# Patient Record
Sex: Male | Born: 1960 | Race: Black or African American | Hispanic: No | Marital: Married | Smoking: Never smoker
Health system: Southern US, Community
[De-identification: ages and names within clinical notes are randomized; demographics above are authoritative.]

## PROBLEM LIST (undated history)

## (undated) ENCOUNTER — Ambulatory Visit: Discharge status: Absent without leave | Payer: 59

## (undated) DIAGNOSIS — B1910 Unspecified viral hepatitis B without hepatic coma: Secondary | ICD-10-CM

## (undated) DIAGNOSIS — R110 Nausea: Secondary | ICD-10-CM

## (undated) DIAGNOSIS — I10 Essential (primary) hypertension: Secondary | ICD-10-CM

## (undated) HISTORY — DX: Essential (primary) hypertension: I10

## (undated) HISTORY — DX: Unspecified viral hepatitis B without hepatic coma: B19.10

---

## 2005-05-04 ENCOUNTER — Encounter: Admission: RE | Admit: 2005-05-04 | Discharge: 2005-05-04 | Payer: Self-pay | Admitting: Family Medicine

## 2005-06-08 ENCOUNTER — Encounter: Admission: RE | Admit: 2005-06-08 | Discharge: 2005-06-08 | Payer: Self-pay | Admitting: Family Medicine

## 2007-03-27 HISTORY — PX: MASS EXCISION: SHX2000

## 2008-04-27 ENCOUNTER — Encounter: Admission: RE | Admit: 2008-04-27 | Discharge: 2008-04-27 | Payer: Self-pay | Admitting: Family Medicine

## 2008-05-11 ENCOUNTER — Encounter: Admission: RE | Admit: 2008-05-11 | Discharge: 2008-05-11 | Payer: Self-pay | Admitting: Family Medicine

## 2008-05-19 ENCOUNTER — Emergency Department (HOSPITAL_COMMUNITY): Admission: EM | Admit: 2008-05-19 | Discharge: 2008-05-19 | Payer: Self-pay | Admitting: Emergency Medicine

## 2008-09-29 ENCOUNTER — Ambulatory Visit (HOSPITAL_COMMUNITY): Admission: RE | Admit: 2008-09-29 | Discharge: 2008-09-29 | Payer: Self-pay | Admitting: General Surgery

## 2008-09-29 ENCOUNTER — Encounter (INDEPENDENT_AMBULATORY_CARE_PROVIDER_SITE_OTHER): Payer: Self-pay | Admitting: General Surgery

## 2009-01-06 ENCOUNTER — Encounter: Admission: RE | Admit: 2009-01-06 | Discharge: 2009-01-06 | Payer: Self-pay | Admitting: Otolaryngology

## 2009-01-10 ENCOUNTER — Encounter: Admission: RE | Admit: 2009-01-10 | Discharge: 2009-01-10 | Payer: Self-pay | Admitting: Otolaryngology

## 2009-11-16 ENCOUNTER — Encounter: Admission: RE | Admit: 2009-11-16 | Discharge: 2009-11-16 | Payer: Self-pay | Admitting: Occupational Medicine

## 2010-04-16 ENCOUNTER — Encounter: Payer: Self-pay | Admitting: Otolaryngology

## 2010-07-02 LAB — WOUND CULTURE: Culture: NO GROWTH

## 2010-07-02 LAB — COMPREHENSIVE METABOLIC PANEL
ALT: 120 U/L — ABNORMAL HIGH (ref 0–53)
Alkaline Phosphatase: 74 U/L (ref 39–117)
CO2: 27 mEq/L (ref 19–32)
Calcium: 9.6 mg/dL (ref 8.4–10.5)
Chloride: 107 mEq/L (ref 96–112)
GFR calc Af Amer: >60 mL/min (ref >60)
Glucose, Bld: 93 mg/dL (ref 70–99)
Potassium: 4 mEq/L (ref 3.5–5.1)
Sodium: 142 mEq/L (ref 135–145)
Total Bilirubin: 0.8 mg/dL (ref 0.3–1.2)
Total Protein: 7.3 g/dL (ref 6.0–8.3)

## 2010-07-02 LAB — GRAM STAIN

## 2010-07-02 LAB — DIFFERENTIAL
Basophils Relative: 1 % (ref 0–1)
Eosinophils Relative: 4 % (ref 0–5)
Lymphocytes Relative: 54 % — ABNORMAL HIGH (ref 12–46)
Lymphs Abs: 3.3 10*3/uL (ref 0.7–4.0)
Monocytes Absolute: 0.5 10*3/uL (ref 0.1–1.0)
Monocytes Relative: 9 % (ref 3–12)
Neutro Abs: 2 10*3/uL (ref 1.7–7.7)

## 2010-07-02 LAB — CBC
Hemoglobin: 15.1 g/dL (ref 13.0–17.0)
MCHC: 34.7 g/dL (ref 30.0–36.0)
Platelets: 116 10*3/uL — ABNORMAL LOW (ref 150–400)
RBC: 5.14 MIL/uL (ref 4.22–5.81)

## 2010-07-11 LAB — DIFFERENTIAL
Basophils Absolute: 0 10*3/uL (ref 0.0–0.1)
Basophils Relative: 0 % (ref 0–1)
Eosinophils Absolute: 0.3 10*3/uL (ref 0.0–0.7)
Eosinophils Relative: 3 % (ref 0–5)
Lymphs Abs: 2.8 10*3/uL (ref 0.7–4.0)
Monocytes Absolute: 0.8 10*3/uL (ref 0.1–1.0)
Monocytes Relative: 9 % (ref 3–12)
Neutro Abs: 5.5 10*3/uL (ref 1.7–7.7)
Neutrophils Relative %: 58 % (ref 43–77)

## 2010-07-11 LAB — CBC
Hemoglobin: 16.2 g/dL (ref 13.0–17.0)
MCV: 84.6 fL (ref 78.0–100.0)
RDW: 14.4 % (ref 11.5–15.5)

## 2010-07-11 LAB — BASIC METABOLIC PANEL
CO2: 28 mEq/L (ref 19–32)
Chloride: 98 mEq/L (ref 96–112)
Creatinine, Ser: 1.31 mg/dL (ref 0.4–1.5)
Glucose, Bld: 131 mg/dL — ABNORMAL HIGH (ref 70–99)
Sodium: 136 mEq/L (ref 135–145)

## 2010-08-08 NOTE — Op Note (Signed)
Thomas Ortega, Thomas Ortega              ACCOUNT NO.:  [id number]   MEDICAL RECORD NO.:  [id number]          PATIENT TYPE:  AMB   LOCATION:  DAY                          FACILITY:  [facility]   PHYSICIAN:  Lennie Muckle, MD      DATE OF BIRTH:  1961-01-01   DATE OF PROCEDURE:  09/29/2008  DATE OF DISCHARGE:                               OPERATIVE REPORT   PREOPERATIVE DIAGNOSIS:  Sebaceous cyst of the neck.   POSTOPERATIVE DIAGNOSIS:  Neck abscess.   PROCEDURE:  Incision and drainage of abscess with excision of abscess  cavity.   SURGEON:  Lennie Muckle, MD   ASSISTANT:  OR tech.   INDICATIONS FOR PROCEDURE:  Mr. Serano is a 50 year old male seen in  the office with a 2-3 week history of neck swelling.  No fevers or  chills.  Exam was consistent with likely a sebaceous cyst, non-infected.  I talked to Mr. Thornsberry and his wife about possible I and D versus  excision of the capsule.  They elected to perform this in the operating  room.  Informed consent was obtained prior to the procedure.   SPECIMEN:  Abscess cavity, as well as cultures obtained in the OR of the  fluid.   BLOOD LOSS:  Minimal.   DRAINS:  None.   PACKING:  4 x 4 gauze.   DETAILS OF PROCEDURE:  Mr. Morawski was identified in the preoperative  holding area.  His neck was examined.  He received a gm of Kefzol and  was taken to the operating room.  Once in the operating room, he was  placed in the supine position.  After administration of MAC anesthesia,  he was placed in a flat lateral decubitus position.  I clipped and  shaved his neck in the area of the cyst.  It was then prepped and draped  in the usual sterile fashion.  A timeout procedure indicating the  patient and procedure were performed.  I placed an incision directly  over the area of concern.  I divided the subcutaneous tissues with  electrocautery.  I encountered a cavity with purulent fluid noted.  This  was more brown colored in nature.  This was  cultured.  I then excised  the capsule as best I could using Allis clamps.  I was able to delineate  more of the inferior portion than the superior portion.  The area was  irrigated.  I gently debrided the area with 4-0 silks and  electrocautery.  After irrigation, the area was packed with a 4 x 4  gauze.  I anesthetized the area with 1% lidocaine and quarter-percent  Marcaine, a mixture of  approximately 20 mL total.  A dry gauze was placed as final dressing.  The patient was then taken to the postanesthesia care unit in stable  condition.  He will be setup with Home Health tomorrow and then come to  see me in the office on Friday.  No antibiotics are indicated as the  area was drained adequately.      Lennie Muckle, MD  Electronically Signed  ALA/MEDQ  D:  09/29/2008  T:  09/29/2008  Job:  [id number]   cc:   Charlesetta Shanks

## 2011-06-18 ENCOUNTER — Ambulatory Visit: Payer: Self-pay | Admitting: Internal Medicine

## 2011-06-20 ENCOUNTER — Ambulatory Visit (INDEPENDENT_AMBULATORY_CARE_PROVIDER_SITE_OTHER): Payer: BC Managed Care – PPO | Admitting: Internal Medicine

## 2011-06-20 ENCOUNTER — Encounter: Payer: Self-pay | Admitting: Internal Medicine

## 2011-06-20 VITALS — BP 130/90 | HR 87 | Temp 98.1°F | Resp 18 | Ht 72.0 in | Wt 225.0 lb

## 2011-06-20 DIAGNOSIS — K2970 Gastritis, unspecified, without bleeding: Secondary | ICD-10-CM

## 2011-06-20 DIAGNOSIS — K219 Gastro-esophageal reflux disease without esophagitis: Secondary | ICD-10-CM | POA: Insufficient documentation

## 2011-06-20 DIAGNOSIS — R7989 Other specified abnormal findings of blood chemistry: Secondary | ICD-10-CM | POA: Insufficient documentation

## 2011-06-20 DIAGNOSIS — R945 Abnormal results of liver function studies: Secondary | ICD-10-CM

## 2011-06-20 DIAGNOSIS — E119 Type 2 diabetes mellitus without complications: Secondary | ICD-10-CM

## 2011-06-20 DIAGNOSIS — I10 Essential (primary) hypertension: Secondary | ICD-10-CM

## 2011-06-20 DIAGNOSIS — E785 Hyperlipidemia, unspecified: Secondary | ICD-10-CM

## 2011-06-20 DIAGNOSIS — E1169 Type 2 diabetes mellitus with other specified complication: Secondary | ICD-10-CM | POA: Insufficient documentation

## 2011-06-20 DIAGNOSIS — K2990 Gastroduodenitis, unspecified, without bleeding: Secondary | ICD-10-CM

## 2011-06-20 DIAGNOSIS — M549 Dorsalgia, unspecified: Secondary | ICD-10-CM

## 2011-06-20 MED ORDER — CYCLOBENZAPRINE HCL 10 MG PO TABS: 10.0000 mg | ORAL_TABLET | Freq: Three times a day (TID) | ORAL | Status: AC | PRN

## 2011-06-20 NOTE — Progress Notes (Signed)
  Subjective:    Patient ID: Thomas Ortega, male    DOB: 1960-08-24, 51 y.o.   MRN: [id number]  HPI Pt presents to clinic for evaluation of multiple medical problems. Notes chronic intermittent back pain. Recent exacerbation involving right lbp without radiation. Pain exacerbated by position change. Taking no medication for the problem. 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 Presents with outside labs 03/2011. AST 953 and AL 1053-lipitor was held. No further labs documented. May have h/o hep b vs carrier. Denies abd pain but has intermittent nausea that previously improved with ppi. Has h. pyloir ab + but abx prescribed were too expensive. H/o DM previously tx'ed with metformin but currently no on medication. No other complaints.  Past Medical History  Diagnosis Date  . Diabetes mellitus   . Hypertension   . Hepatitis B    Past Surgical History  Procedure Date  . Mass excision 2009    base of skull    reports that he has never smoked. He has never used smokeless tobacco. He reports that he does not drink alcohol or use illicit drugs. family history includes Hypertension in his mother.  There is no history of Prostate cancer, and Colon cancer, and Breast cancer, and Heart disease, and Diabetes, . No Known Allergies   Review of Systems  Constitutional: Negative for fever and fatigue.  Gastrointestinal: Positive for nausea. Negative for abdominal pain.  Musculoskeletal: Positive for back pain. Negative for gait problem.  All other systems reviewed and are negative.       Objective:   Physical Exam  Constitutional: He appears well-developed and well-nourished. No distress.  HENT:  Head: Normocephalic and atraumatic.  Eyes: Conjunctivae are normal. No scleral icterus.  Neck: Neck supple. Carotid bruit is not present.  Cardiovascular: Normal rate, regular rhythm and normal heart sounds.  Exam reveals no gallop and no friction rub.   No murmur  heard. Pulmonary/Chest: Effort normal and breath sounds normal. No respiratory distress. He has no wheezes. He has no rales.  Abdominal: Soft. Bowel sounds are normal. He exhibits no distension and no mass. There is no tenderness. There is no rebound and no guarding.  Musculoskeletal:       No midline ls tenderness or bony abn. Right paraspinal muscle spasm. Gait nl  Neurological: He is alert.  Skin: Skin is warm and dry. He is not diaphoretic.  Psychiatric: He has a normal mood and affect.          Assessment & Plan:

## 2011-06-20 NOTE — Assessment & Plan Note (Signed)
?   H. Pylori + and possibly untreated. Attempt ppi qd. Discuss abx pending lft f/u

## 2011-06-20 NOTE — Assessment & Plan Note (Signed)
Stat repeat lft. Continue to hold statin. Avoid etoh and tylenol. Close f/u scheduled.

## 2011-06-20 NOTE — Assessment & Plan Note (Signed)
Muscle spasm on exam. Attempt flexeril prn

## 2011-06-29 ENCOUNTER — Ambulatory Visit (INDEPENDENT_AMBULATORY_CARE_PROVIDER_SITE_OTHER): Payer: BC Managed Care – PPO | Admitting: Internal Medicine

## 2011-06-29 ENCOUNTER — Telehealth: Payer: Self-pay | Admitting: Internal Medicine

## 2011-06-29 ENCOUNTER — Encounter: Payer: Self-pay | Admitting: Internal Medicine

## 2011-06-29 VITALS — BP 130/90 | HR 82 | Temp 98.0°F | Resp 18

## 2011-06-29 DIAGNOSIS — R7989 Other specified abnormal findings of blood chemistry: Secondary | ICD-10-CM

## 2011-06-29 DIAGNOSIS — E785 Hyperlipidemia, unspecified: Secondary | ICD-10-CM

## 2011-06-29 DIAGNOSIS — M549 Dorsalgia, unspecified: Secondary | ICD-10-CM

## 2011-06-29 DIAGNOSIS — R945 Abnormal results of liver function studies: Secondary | ICD-10-CM

## 2011-06-29 DIAGNOSIS — E119 Type 2 diabetes mellitus without complications: Secondary | ICD-10-CM

## 2011-06-29 MED ORDER — METHYLPREDNISOLONE 4 MG PO KIT: PACK | ORAL | Status: AC

## 2011-06-29 MED ORDER — AMLODIPINE BESYLATE 10 MG PO TABS: 10.0000 mg | ORAL_TABLET | Freq: Every day | ORAL | Status: DC

## 2011-06-29 MED ORDER — POLYMYXIN B-TRIMETHOPRIM 10000-0.1 UNIT/ML-% OP SOLN: 1.0000 [drp] | OPHTHALMIC | Status: AC

## 2011-06-29 NOTE — Patient Instructions (Signed)
Please schedule fasting labs prior to next visit Cbc, chem7, a1c, urine microalbumin 250.0, lipid, lft 272.4

## 2011-06-29 NOTE — Assessment & Plan Note (Signed)
Continue to hold statin due to elevated lft. Obtain lipid/lft prior to next visit

## 2011-06-29 NOTE — Assessment & Plan Note (Signed)
No improvement with flexeril. Attempt medrol dosepak. Consider PT if no improvement

## 2011-06-29 NOTE — Assessment & Plan Note (Signed)
Improved. Continue to hold lipitor. Obtain lft and hep panel prior to next visit

## 2011-06-29 NOTE — Telephone Encounter (Signed)
Lab orders entered for June 2013. 

## 2011-06-29 NOTE — Progress Notes (Signed)
  Subjective:    Patient ID: Thomas Ortega, male    DOB: [birth year], 51 y.o.   MRN: [id number]  HPI Pt presents to clinic for followup of multiple medical problems. Notes no significant improvement of low back pain. Denies radicular sx's. Nausea resolved with ppi. C/o 3d h/o right eye irritation and clear drainage without trauma or change in vision. Reviewed improvement of lft's. Outside labs 1/13 showed ast 953 and alt 1053. lipitor apparently held at that point. Recent repeat lft's mildly elevated. ?h/o hep b carrier status.   Past Medical History  Diagnosis Date  . Diabetes mellitus   . Hypertension   . Hepatitis B    Past Surgical History  Procedure Date  . Mass excision 2009    base of skull    reports that he has never smoked. He has never used smokeless tobacco. He reports that he does not drink alcohol or use illicit drugs. family history includes Hypertension in his mother.  There is no history of Prostate cancer, and Colon cancer, and Breast cancer, and Heart disease, and Diabetes, . No Known Allergies    Review of Systems see hpi     Objective:   Physical Exam  Nursing note and vitals reviewed. Constitutional: He appears well-developed and well-nourished. No distress.  HENT:  Head: Normocephalic and atraumatic.  Neurological: He is alert.       Gait nl  Skin: He is not diaphoretic.  Psychiatric: He has a normal mood and affect.          Assessment & Plan:

## 2011-07-06 ENCOUNTER — Telehealth: Payer: Self-pay | Admitting: Internal Medicine

## 2011-07-06 ENCOUNTER — Other Ambulatory Visit: Payer: Self-pay | Admitting: Internal Medicine

## 2011-07-06 DIAGNOSIS — M549 Dorsalgia, unspecified: Secondary | ICD-10-CM

## 2011-07-06 MED ORDER — HYDROCODONE-ACETAMINOPHEN 5-325 MG PO TABS: 1.0000 | ORAL_TABLET | Freq: Four times a day (QID) | ORAL | Status: AC | PRN

## 2011-07-06 MED ORDER — ONDANSETRON 8 MG PO TBDP: 8.0000 mg | ORAL_TABLET | Freq: Three times a day (TID) | ORAL | Status: AC | PRN

## 2011-07-06 NOTE — Telephone Encounter (Signed)
Patients wife Stanton Kidney states that she was told to call our office if patient was not feeling any better. She states that patient is still experiencing a sharp pain at all times on his right side.

## 2011-07-06 NOTE — Telephone Encounter (Signed)
Has been taking flexeril and prednisone. Recommend PT referral. Can call in vicodin 5/325mg  po q6 hours prn pain #30 for temporary use. If no improvement with pt will need repeat mri of back. Also see if can go to lab for UA with reflex

## 2011-07-06 NOTE — Telephone Encounter (Signed)
Call placed to patient at [phone], he was informed per Dr Rodena Medin instructions and has verbalized understanding. Rx sent to [facility]. Patient stated he will provide a urine specimen when he picks up Rx.

## 2011-07-06 NOTE — Telephone Encounter (Signed)
Call placed to patient at [phone], he states that he is bothered by back pain. He has nausea, no vomiting,  Some light headedness, and urinary frequency. He denies having a fever.

## 2011-07-07 LAB — URINALYSIS, ROUTINE W REFLEX MICROSCOPIC
Leukocytes, UA: NEGATIVE
Protein, ur: NEGATIVE mg/dL
Specific Gravity, Urine: 1.044 — ABNORMAL HIGH (ref 1.005–1.030)
Urobilinogen, UA: 0.2 mg/dL (ref 0.0–1.0)

## 2011-07-07 LAB — URINALYSIS, MICROSCOPIC ONLY
Crystals: NONE SEEN
Squamous Epithelial / LPF: NONE SEEN

## 2011-07-09 ENCOUNTER — Other Ambulatory Visit: Payer: Self-pay | Admitting: Internal Medicine

## 2011-07-09 DIAGNOSIS — E119 Type 2 diabetes mellitus without complications: Secondary | ICD-10-CM

## 2011-07-09 MED ORDER — ONETOUCH ULTRASOFT LANCETS MISC: Status: DC

## 2011-07-09 MED ORDER — ONETOUCH ULTRA SYSTEM W/DEVICE KIT: PACK | Status: DC

## 2011-07-09 MED ORDER — GLUCOSE BLOOD VI STRP: ORAL_STRIP | Status: DC

## 2011-07-09 MED ORDER — FREESTYLE LANCETS MISC: Status: DC

## 2011-07-12 ENCOUNTER — Other Ambulatory Visit: Payer: Self-pay | Admitting: Internal Medicine

## 2011-07-12 ENCOUNTER — Telehealth: Payer: Self-pay | Admitting: Internal Medicine

## 2011-07-12 MED ORDER — METFORMIN HCL 1000 MG PO TABS: 1000.0000 mg | ORAL_TABLET | Freq: Two times a day (BID) | ORAL | Status: DC

## 2011-07-12 NOTE — Telephone Encounter (Signed)
Call placed to patient at [phone], he was informed per Dr Rodena Medin instructions, and has verbalized understanding. He has requested the Rx be sent to [facility]. Rx sent to pharmacy.

## 2011-07-12 NOTE — Telephone Encounter (Signed)
Much too high. i believe he used to take metformin but is off all medications currently. Recommend resuming metformin 1000mg  bid #60 rf6. fsbs bid. appt next week and bring fsbs log

## 2011-07-12 NOTE — Telephone Encounter (Signed)
Rx sent to [facility].

## 2011-07-12 NOTE — Telephone Encounter (Signed)
Patient wife states that patient took blood sugar readings yesterday and today. Readings were 298 and 322. She states that patient was fasting before readings were took. She wants to know if these readings are high?

## 2011-07-12 NOTE — Telephone Encounter (Signed)
Patient states that he does not want his medication to be sent to [facility]. He wants his medication(metformin) to be sent to [facility].

## 2011-07-17 ENCOUNTER — Encounter: Payer: Self-pay | Admitting: Internal Medicine

## 2011-07-17 ENCOUNTER — Other Ambulatory Visit: Payer: Self-pay | Admitting: *Deleted

## 2011-07-17 ENCOUNTER — Ambulatory Visit (INDEPENDENT_AMBULATORY_CARE_PROVIDER_SITE_OTHER): Payer: BC Managed Care – PPO | Admitting: Internal Medicine

## 2011-07-17 VITALS — BP 118/78 | HR 82 | Temp 97.5°F | Ht 72.0 in | Wt 222.0 lb

## 2011-07-17 DIAGNOSIS — E119 Type 2 diabetes mellitus without complications: Secondary | ICD-10-CM

## 2011-07-17 DIAGNOSIS — K2970 Gastritis, unspecified, without bleeding: Secondary | ICD-10-CM

## 2011-07-17 DIAGNOSIS — K2990 Gastroduodenitis, unspecified, without bleeding: Secondary | ICD-10-CM

## 2011-07-17 DIAGNOSIS — M549 Dorsalgia, unspecified: Secondary | ICD-10-CM

## 2011-07-17 DIAGNOSIS — R945 Abnormal results of liver function studies: Secondary | ICD-10-CM

## 2011-07-17 DIAGNOSIS — E785 Hyperlipidemia, unspecified: Secondary | ICD-10-CM

## 2011-07-17 MED ORDER — OMEPRAZOLE 20 MG PO CPDR: 20.0000 mg | DELAYED_RELEASE_CAPSULE | Freq: Two times a day (BID) | ORAL | Status: DC

## 2011-07-17 MED ORDER — SITAGLIPTIN PHOSPHATE 100 MG PO TABS: 100.0000 mg | ORAL_TABLET | Freq: Every day | ORAL | Status: DC

## 2011-07-17 MED ORDER — METRONIDAZOLE 250 MG PO TABS: 250.0000 mg | ORAL_TABLET | Freq: Four times a day (QID) | ORAL | Status: DC

## 2011-07-17 MED ORDER — BIS SUBCIT-METRONID-TETRACYC 140-125-125 MG PO CAPS: 3.0000 | ORAL_CAPSULE | Freq: Three times a day (TID) | ORAL | Status: DC

## 2011-07-17 MED ORDER — TETRACYCLINE HCL 500 MG PO CAPS: 500.0000 mg | ORAL_CAPSULE | Freq: Four times a day (QID) | ORAL | Status: DC

## 2011-07-17 NOTE — Progress Notes (Signed)
  Subjective:    Patient ID: Thomas Ortega, male    DOB: 01/19/1961, 51 y.o.   MRN: [id number]  HPI Pt presents to clinic for followup of multiple medical problems. Recently began checking fsbs with range of 200-400 without hypoglycemia. States previously did not have insurance and was unable to do fsbs. Taking metformin now without adverse effect. Completing prednisone taper without improvement of lbp. Pain is severe without radicular sx's, paresthesia or leg weakness. Past ls mri 2007 demonstrated disc protrusion with effect on s1 nerve root. Has attempted prednisone, flexeril, vicodin all without improvement. Notes continued chronic nausea without abd pain. Past pmd dx'ed h pylori gastritis but could not afford abx's at the time. No h/o EGD. No improvement with daily ppi.   Past Medical History  Diagnosis Date  . Diabetes mellitus   . Hypertension   . Hepatitis B    Past Surgical History  Procedure Date  . Mass excision 2009    base of skull    reports that he has never smoked. He has never used smokeless tobacco. He reports that he does not drink alcohol or use illicit drugs. family history includes Hypertension in his mother.  There is no history of Prostate cancer, and Colon cancer, and Breast cancer, and Heart disease, and Diabetes, . No Known Allergies    Review of Systems see hpi     Objective:   Physical Exam  Nursing note and vitals reviewed. Constitutional: He appears well-developed and well-nourished. No distress.  HENT:  Head: Normocephalic and atraumatic.  Musculoskeletal:       bialateral le strength 5/5. Gait nl.  Neurological: He is alert.  Skin: Skin is warm and dry. He is not diaphoretic.  Psychiatric: He has a normal mood and affect.          Assessment & Plan:

## 2011-07-17 NOTE — Assessment & Plan Note (Signed)
Poor control. Stop prednisone. Add januvia samples 100mg  qd. Close f/u in one week or sooner if necessary. Not felt to be clinically volume deplete.

## 2011-07-17 NOTE — Assessment & Plan Note (Signed)
Persistent severe pain. Proceed with back specialist referral.

## 2011-07-17 NOTE — Assessment & Plan Note (Signed)
H/o untreated h pylori +ab. Attempt ppi with abx eradication. Consider gi consult if sx's persist

## 2011-07-18 LAB — BASIC METABOLIC PANEL
CO2: 26 mEq/L (ref 19–32)
Calcium: 9.7 mg/dL (ref 8.4–10.5)
Chloride: 100 mEq/L (ref 96–112)
Creat: 1.13 mg/dL (ref 0.50–1.35)
Glucose, Bld: 234 mg/dL — ABNORMAL HIGH (ref 70–99)

## 2011-07-18 LAB — HEPATIC FUNCTION PANEL
ALT: 57 U/L — ABNORMAL HIGH (ref 0–53)
Albumin: 4.8 g/dL (ref 3.5–5.2)
Alkaline Phosphatase: 107 U/L (ref 39–117)
Indirect Bilirubin: 0.5 mg/dL (ref 0.0–0.9)
Total Protein: 8 g/dL (ref 6.0–8.3)

## 2011-07-18 LAB — CBC
Hemoglobin: 15.6 g/dL (ref 13.0–17.0)
MCH: 29.8 pg (ref 26.0–34.0)
MCV: 82.8 fL (ref 78.0–100.0)
RBC: 5.23 MIL/uL (ref 4.22–5.81)
WBC: 7.3 10*3/uL (ref 4.0–10.5)

## 2011-07-18 LAB — LIPID PANEL
Cholesterol: 180 mg/dL (ref 0–200)
HDL: 37 mg/dL — ABNORMAL LOW (ref >39)
LDL Cholesterol: 111 mg/dL — ABNORMAL HIGH (ref 0–99)
Triglycerides: 158 mg/dL — ABNORMAL HIGH (ref <150)

## 2011-07-18 LAB — HEPATITIS PANEL, ACUTE: Hep A IgM: NEGATIVE

## 2011-07-18 LAB — MICROALBUMIN / CREATININE URINE RATIO
Creatinine, Urine: 250.9 mg/dL
Microalb, Ur: 1.38 mg/dL (ref 0.00–1.89)

## 2011-07-24 ENCOUNTER — Telehealth: Payer: Self-pay | Admitting: Internal Medicine

## 2011-07-24 ENCOUNTER — Ambulatory Visit (INDEPENDENT_AMBULATORY_CARE_PROVIDER_SITE_OTHER): Payer: BC Managed Care – PPO | Admitting: Internal Medicine

## 2011-07-24 ENCOUNTER — Encounter: Payer: Self-pay | Admitting: Gastroenterology

## 2011-07-24 ENCOUNTER — Encounter: Payer: Self-pay | Admitting: Internal Medicine

## 2011-07-24 VITALS — BP 110/78 | HR 83 | Temp 98.3°F | Ht 72.0 in | Wt 225.0 lb

## 2011-07-24 DIAGNOSIS — K2990 Gastroduodenitis, unspecified, without bleeding: Secondary | ICD-10-CM

## 2011-07-24 DIAGNOSIS — D696 Thrombocytopenia, unspecified: Secondary | ICD-10-CM

## 2011-07-24 DIAGNOSIS — E119 Type 2 diabetes mellitus without complications: Secondary | ICD-10-CM

## 2011-07-24 DIAGNOSIS — K2970 Gastritis, unspecified, without bleeding: Secondary | ICD-10-CM

## 2011-07-24 DIAGNOSIS — M549 Dorsalgia, unspecified: Secondary | ICD-10-CM

## 2011-07-24 MED ORDER — SITAGLIPTIN PHOSPHATE 100 MG PO TABS: 100.0000 mg | ORAL_TABLET | Freq: Every day | ORAL | Status: DC

## 2011-07-24 NOTE — Patient Instructions (Signed)
Please schedule cbc with diff (decreased platelets prior to next visit)

## 2011-07-24 NOTE — Progress Notes (Signed)
  Subjective:    Patient ID: Thomas Ortega, male    DOB: 1961/03/08, 51 y.o.   MRN: [id number]  HPI Pt presents to clinic for followup of multiple medical problems. fsbs log reviewed and glucose improving. Recently 140-150's without hypoglycemia. Continues with nausea with h/o +h. Pylori ab despite ppi and abx tx. Now notes dark stool. H/o abn lft improved, decreased plt count without gross active bleeding and possible h/o hep b. Back pain continues to be poorly controlled with past h/o herniated disc 2004.  Past Medical History  Diagnosis Date  . Diabetes mellitus   . Hypertension   . Hepatitis B    Past Surgical History  Procedure Date  . Mass excision 2009    base of skull    reports that he has never smoked. He has never used smokeless tobacco. He reports that he does not drink alcohol or use illicit drugs. family history includes Hypertension in his mother.  There is no history of Prostate cancer, and Colon cancer, and Breast cancer, and Heart disease, and Diabetes, . No Known Allergies    Review of Systems see hpi     Objective:   Physical Exam  Nursing note and vitals reviewed. Constitutional: He appears well-developed and well-nourished. No distress.  HENT:  Head: Normocephalic and atraumatic.  Abdominal: Soft. He exhibits no distension. There is no tenderness. There is no rebound and no guarding.  Neurological: He is alert.  Skin: Skin is warm and dry. He is not diaphoretic.  Psychiatric: He has a normal mood and affect.          Assessment & Plan:

## 2011-07-24 NOTE — Telephone Encounter (Signed)
Lab order entered for May 2013. 

## 2011-07-27 ENCOUNTER — Ambulatory Visit: Payer: Self-pay | Admitting: Family Medicine

## 2011-07-29 NOTE — Assessment & Plan Note (Signed)
Improving control. Samples of januvia provided.

## 2011-07-29 NOTE — Assessment & Plan Note (Signed)
Specialist referral pending

## 2011-07-29 NOTE — Assessment & Plan Note (Signed)
Request GI consult.

## 2011-08-14 ENCOUNTER — Other Ambulatory Visit: Payer: BC Managed Care – PPO

## 2011-08-14 ENCOUNTER — Ambulatory Visit (INDEPENDENT_AMBULATORY_CARE_PROVIDER_SITE_OTHER): Payer: BC Managed Care – PPO | Admitting: Gastroenterology

## 2011-08-14 ENCOUNTER — Encounter: Payer: Self-pay | Admitting: Gastroenterology

## 2011-08-14 DIAGNOSIS — B181 Chronic viral hepatitis B without delta-agent: Secondary | ICD-10-CM

## 2011-08-14 DIAGNOSIS — R110 Nausea: Secondary | ICD-10-CM

## 2011-08-14 DIAGNOSIS — R1030 Lower abdominal pain, unspecified: Secondary | ICD-10-CM

## 2011-08-14 DIAGNOSIS — K5900 Constipation, unspecified: Secondary | ICD-10-CM

## 2011-08-14 DIAGNOSIS — R109 Unspecified abdominal pain: Secondary | ICD-10-CM

## 2011-08-14 LAB — AFP TUMOR MARKER: AFP-Tumor Marker: 3.9 ng/mL (ref 0.0–8.0)

## 2011-08-14 MED ORDER — MOVIPREP 100 G PO SOLR: 1.0000 | ORAL | Status: DC

## 2011-08-14 NOTE — Patient Instructions (Addendum)
You will be set up for a colonoscopy for constipation, lower abd pains. You will be set up for an upper endoscopy for nausea. You will be set up for an ultrasound of liver to screen for hepatoma (has chronic Hep B).  Please arrive at [facility] on 08/16/11 at 815 am and have nothing to eat or drink after midnight. You will have labs checked today in the basement lab.  Please head down after you check out with the front desk  (hepatitis B surface antibody, hepatitis B E antibody, hepatitis B E antigen).  AFP.

## 2011-08-14 NOTE — Progress Notes (Signed)
HPI: This is a   very pleasant 51 year old man born in [place] whom I am meeting for the first time today.   Has a lot of nausea, abd pain.  This has been going on for about 2 years.  Pains occur about 2 tiems a week, lower abdomen bilaterally.  Can last 10 min, cramping.  He tends to be constipated.  HAs bm every 2 days, occasional straining.  Nausea, globus sensation.  Has nause intermittertently.  Not related to any pains.  Early satiety.  Has DM, recently diagnosed.  Has hepatitis B, diagnosed 3 years.  + pyrosis, no NSAIDS.  He has a history of hepatitis B. He was told about this 3 years ago. Recent liver tests reviewed show a very mild transaminitis. See ultrasound for about 3 years ago shows no sign of hepatoma or cirrhosis.   Review of systems: Pertinent positive and negative review of systems were noted in the above HPI section. Complete review of systems was performed and was otherwise normal.    Past Medical History  Diagnosis Date  . Diabetes mellitus   . Hypertension   . Hepatitis B     Past Surgical History  Procedure Date  . Mass excision 2009    base of skull    Current Outpatient Prescriptions  Medication Sig Dispense Refill  . bismuth-metronidazole-tetracycline (PLYERA) 140-125-125 MG per capsule Take 3 capsules by mouth 4 (four) times daily -  with meals and at bedtime.      Marland Kitchen amLODipine (NORVASC) 10 MG tablet Take 1 tablet (10 mg total) by mouth daily.  30 tablet  6  . Blood Glucose Monitoring Suppl (ONE TOUCH ULTRA SYSTEM KIT) W/DEVICE KIT Use as instructed to check blood sugar once a day Dx Code 250.00  1 each  0  . glucose blood (ONE TOUCH ULTRA TEST) test strip Use as instructed to check blood sugar once a day Dx Code 250.00  100 each  3  . Lancets (ONETOUCH ULTRASOFT) lancets Use as instructed to check blood sugar once a day Dx Code 250.00  100 each  3  . metFORMIN (GLUCOPHAGE) 1000 MG tablet Take 1 tablet (1,000 mg total) by mouth 2 (two) times daily  with a meal.  60 tablet  6  . sitaGLIPtin (JANUVIA) 100 MG tablet Take 1 tablet (100 mg total) by mouth daily.  30 tablet  6    Allergies as of 08/14/2011  . (No Known Allergies)    Family History  Problem Relation Age of Onset  . Prostate cancer Neg Hx   . Colon cancer Neg Hx   . Breast cancer Neg Hx   . Heart disease Neg Hx   . Diabetes Neg Hx     History   Social History  . Marital Status: Married    Spouse Name: N/A    Number of Children: 1  . Years of Education: N/A   Occupational History  . unemployed    Social History Main Topics  . Smoking status: Never Smoker   . Smokeless tobacco: Never Used  . Alcohol Use: No  . Drug Use: No  . Sexually Active: Not on file   Other Topics Concern  . Not on file   Social History Narrative  . No narrative on file       Physical Exam: BP 110/76  Pulse 88  Ht 6\' 1"  (1.854 m)  Wt 227 lb (102.967 kg)  BMI 29.95 kg/m2 Constitutional: generally well-appearing Psychiatric: alert and oriented x3  Eyes: extraocular movements intact Mouth: oral pharynx moist, no lesions Neck: supple no lymphadenopathy Cardiovascular: heart regular rate and rhythm Lungs: clear to auscultation bilaterally Abdomen: soft, nontender, nondistended, no obvious ascites, no peritoneal signs, normal bowel sounds Extremities: no lower extremity edema bilaterally Skin: no lesions on visible extremities    Assessment and plan: 51 y.o. male with  lower tunnel discomforts, chronic constipation, intermittent nausea, chronic hepatitis B  First I think we should proceed with colonoscopy and upper endoscopy for his chronic constipation, intermittent lower abdominal pains, nausea, globus-like sensation. He also has chronic hepatitis B and I do not think has been fully evaluated. He needs imaging of his liver to screen him for hepatoma. Alpha-fetoprotein as well. He will get some other hepatitis B serologies. I will probably be sending him to hepatitis  specialty clinic here in town pending the results of his other tests.

## 2011-08-16 ENCOUNTER — Ambulatory Visit (HOSPITAL_COMMUNITY)
Admission: RE | Admit: 2011-08-16 | Discharge: 2011-08-16 | Disposition: A | Discharge status: Home / Self Care | Payer: BC Managed Care – PPO | Source: Ambulatory Visit | Attending: Gastroenterology | Admitting: Gastroenterology

## 2011-08-16 DIAGNOSIS — K5900 Constipation, unspecified: Secondary | ICD-10-CM | POA: Insufficient documentation

## 2011-08-16 DIAGNOSIS — R110 Nausea: Secondary | ICD-10-CM

## 2011-08-16 DIAGNOSIS — R1030 Lower abdominal pain, unspecified: Secondary | ICD-10-CM

## 2011-08-16 DIAGNOSIS — B181 Chronic viral hepatitis B without delta-agent: Secondary | ICD-10-CM

## 2011-08-16 DIAGNOSIS — R109 Unspecified abdominal pain: Secondary | ICD-10-CM | POA: Insufficient documentation

## 2011-08-16 DIAGNOSIS — R112 Nausea with vomiting, unspecified: Secondary | ICD-10-CM | POA: Insufficient documentation

## 2011-08-16 LAB — HEPATITIS B E ANTIBODY: Hepatitis Be Antibody: POSITIVE — AB

## 2011-08-21 ENCOUNTER — Other Ambulatory Visit (HOSPITAL_COMMUNITY): Payer: Self-pay | Admitting: Neurosurgery

## 2011-08-21 ENCOUNTER — Telehealth: Payer: Self-pay | Admitting: Gastroenterology

## 2011-08-21 DIAGNOSIS — M549 Dorsalgia, unspecified: Secondary | ICD-10-CM

## 2011-08-21 DIAGNOSIS — M5416 Radiculopathy, lumbar region: Secondary | ICD-10-CM

## 2011-08-21 NOTE — Telephone Encounter (Signed)
Left message on machine to call back  See alternate result note

## 2011-08-23 ENCOUNTER — Other Ambulatory Visit: Payer: Self-pay

## 2011-08-23 ENCOUNTER — Ambulatory Visit (HOSPITAL_COMMUNITY)
Admission: RE | Admit: 2011-08-23 | Discharge: 2011-08-23 | Disposition: A | Discharge status: Home / Self Care | Payer: BC Managed Care – PPO | Source: Ambulatory Visit | Attending: Neurosurgery | Admitting: Neurosurgery

## 2011-08-23 DIAGNOSIS — M549 Dorsalgia, unspecified: Secondary | ICD-10-CM

## 2011-08-23 DIAGNOSIS — M545 Low back pain, unspecified: Secondary | ICD-10-CM | POA: Insufficient documentation

## 2011-08-23 DIAGNOSIS — B181 Chronic viral hepatitis B without delta-agent: Secondary | ICD-10-CM

## 2011-08-23 DIAGNOSIS — M5380 Other specified dorsopathies, site unspecified: Secondary | ICD-10-CM | POA: Insufficient documentation

## 2011-08-23 DIAGNOSIS — M79609 Pain in unspecified limb: Secondary | ICD-10-CM | POA: Insufficient documentation

## 2011-08-23 DIAGNOSIS — M5416 Radiculopathy, lumbar region: Secondary | ICD-10-CM

## 2011-08-23 DIAGNOSIS — M5126 Other intervertebral disc displacement, lumbar region: Secondary | ICD-10-CM | POA: Insufficient documentation

## 2011-08-23 DIAGNOSIS — M51379 Other intervertebral disc degeneration, lumbosacral region without mention of lumbar back pain or lower extremity pain: Secondary | ICD-10-CM | POA: Insufficient documentation

## 2011-08-23 DIAGNOSIS — M5137 Other intervertebral disc degeneration, lumbosacral region: Secondary | ICD-10-CM | POA: Insufficient documentation

## 2011-08-23 LAB — CBC
MCV: 83 fL (ref 78.0–100.0)
Platelets: 129 10*3/uL — ABNORMAL LOW (ref 150–400)
RBC: 4.82 MIL/uL (ref 4.22–5.81)
WBC: 6 10*3/uL (ref 4.0–10.5)

## 2011-08-23 NOTE — Telephone Encounter (Signed)
Addended by: Mervin Kung A on: 08/23/2011 08:16 AM   Modules accepted: Orders

## 2011-08-23 NOTE — Telephone Encounter (Signed)
Pt presented to the lab, order released. 

## 2011-08-28 ENCOUNTER — Telehealth: Payer: Self-pay | Admitting: Internal Medicine

## 2011-08-28 ENCOUNTER — Encounter: Payer: Self-pay | Admitting: Internal Medicine

## 2011-08-28 ENCOUNTER — Ambulatory Visit (INDEPENDENT_AMBULATORY_CARE_PROVIDER_SITE_OTHER): Payer: BC Managed Care – PPO | Admitting: Internal Medicine

## 2011-08-28 VITALS — BP 120/80 | HR 68 | Temp 98.3°F | Resp 18 | Ht 72.0 in | Wt 225.0 lb

## 2011-08-28 DIAGNOSIS — E785 Hyperlipidemia, unspecified: Secondary | ICD-10-CM

## 2011-08-28 DIAGNOSIS — M549 Dorsalgia, unspecified: Secondary | ICD-10-CM

## 2011-08-28 DIAGNOSIS — R21 Rash and other nonspecific skin eruption: Secondary | ICD-10-CM | POA: Insufficient documentation

## 2011-08-28 DIAGNOSIS — E119 Type 2 diabetes mellitus without complications: Secondary | ICD-10-CM

## 2011-08-28 DIAGNOSIS — I10 Essential (primary) hypertension: Secondary | ICD-10-CM

## 2011-08-28 MED ORDER — PREDNISONE 10 MG PO TABS: ORAL_TABLET | ORAL | Status: AC

## 2011-08-28 NOTE — Assessment & Plan Note (Signed)
Attempt prednisone taper. Monitor fsbs carefully. Consider derm consult if refractory

## 2011-08-28 NOTE — Assessment & Plan Note (Signed)
Normotensive and stable. Continue current regimen. Monitor bp as outpt and followup in clinic as scheduled.  

## 2011-08-28 NOTE — Patient Instructions (Signed)
Please schedule fasting labs prior to next appointment Chem7, a1c-250, lipid/lft-272.4

## 2011-08-28 NOTE — Assessment & Plan Note (Signed)
Improved control. Continue current regimen. Obtain chem7, a1c prior to next visit

## 2011-08-28 NOTE — Telephone Encounter (Signed)
Lab order entered for August 2013. 

## 2011-08-28 NOTE — Progress Notes (Signed)
  Subjective:    Patient ID: Thomas Ortega, male    DOB: 07-05-1960, 51 y.o.   MRN: [id number]  HPI Pt presents to clinic for followup of multiple medical problems. Reports fsbs 90's without hypoglycemia. S/p ls mri showing stable l5-s1 disc protrusion-currently seeing neurosurgery. Requests handicap placard due to back pain. Scheduled for EGD/colonoscopy in near future by GI. Notes several month h/o intermittent itchy rash primarily located buttock/legs. Changed detergents and other chemicals without resolution. Typically lasts 5 days followed by spontaneous resolution without treatment.   Past Medical History  Diagnosis Date  . Diabetes mellitus   . Hypertension   . Hepatitis B    Past Surgical History  Procedure Date  . Mass excision 2009    base of skull    reports that he has never smoked. He has never used smokeless tobacco. He reports that he does not drink alcohol or use illicit drugs. family history is negative for Prostate cancer, and Colon cancer, and Breast cancer, and Heart disease, and Diabetes, . No Known Allergies    Review of Systems see hpi     Objective:   Physical Exam  Nursing note and vitals reviewed. Constitutional: He appears well-developed and well-nourished. No distress.  Neurological: He is alert.       Gait nl  Skin: Skin is warm and dry. He is not diaphoretic.       No current visible rash  Psychiatric: He has a normal mood and affect.          Assessment & Plan:

## 2011-08-28 NOTE — Assessment & Plan Note (Signed)
Follow up with neurosurgery. Temporary handicap placard provided due to back pain.

## 2011-08-29 ENCOUNTER — Other Ambulatory Visit: Payer: Self-pay

## 2011-08-29 DIAGNOSIS — B181 Chronic viral hepatitis B without delta-agent: Secondary | ICD-10-CM

## 2011-08-29 NOTE — Progress Notes (Signed)
This test is being ordered per [facility].

## 2011-08-31 ENCOUNTER — Ambulatory Visit: Payer: BC Managed Care – PPO | Admitting: Internal Medicine

## 2011-09-12 ENCOUNTER — Telehealth: Payer: Self-pay

## 2011-09-12 ENCOUNTER — Encounter: Payer: BC Managed Care – PPO | Admitting: Gastroenterology

## 2011-09-12 NOTE — Telephone Encounter (Signed)
Unable to reach pt the cell number and home number provided does not accept incoming calls.  Letter to pt with lab reminder

## 2011-09-12 NOTE — Telephone Encounter (Signed)
Message copied by Donata Duff on Wed Sep 12, 2011  8:03 AM ------      Message from: Donata Duff      Created: Wed Aug 29, 2011  1:28 PM       Pt to get labs

## 2011-09-18 ENCOUNTER — Encounter: Payer: Self-pay | Admitting: Gastroenterology

## 2011-09-18 ENCOUNTER — Ambulatory Visit (AMBULATORY_SURGERY_CENTER): Payer: BC Managed Care – PPO | Admitting: Gastroenterology

## 2011-09-18 VITALS — BP 143/94 | HR 75 | Temp 97.5°F | Resp 21 | Ht 73.0 in | Wt 227.0 lb

## 2011-09-18 DIAGNOSIS — K2990 Gastroduodenitis, unspecified, without bleeding: Secondary | ICD-10-CM

## 2011-09-18 DIAGNOSIS — K2970 Gastritis, unspecified, without bleeding: Secondary | ICD-10-CM

## 2011-09-18 DIAGNOSIS — K5900 Constipation, unspecified: Secondary | ICD-10-CM

## 2011-09-18 DIAGNOSIS — R109 Unspecified abdominal pain: Secondary | ICD-10-CM

## 2011-09-18 DIAGNOSIS — R110 Nausea: Secondary | ICD-10-CM

## 2011-09-18 LAB — GLUCOSE, CAPILLARY: Glucose-Capillary: 96 mg/dL (ref 70–99)

## 2011-09-18 MED ORDER — SODIUM CHLORIDE 0.9 % IV SOLN: 500.0000 mL | INTRAVENOUS | Status: DC

## 2011-09-18 NOTE — Patient Instructions (Addendum)
YOU HAD AN ENDOSCOPIC PROCEDURE TODAY AT THE [facility]: Refer to the procedure report that was given to you for any specific questions about what was found during the examination.  If the procedure report does not answer your questions, please call your gastroenterologist to clarify.  If you requested that your care partner not be given the details of your procedure findings, then the procedure report has been included in a sealed envelope for you to review at your convenience later.  YOU SHOULD EXPECT: Some feelings of bloating in the abdomen. Passage of more gas than usual.  Walking can help get rid of the air that was put into your GI tract during the procedure and reduce the bloating. If you had a lower endoscopy (such as a colonoscopy or flexible sigmoidoscopy) you may notice spotting of blood in your stool or on the toilet paper. If you underwent a bowel prep for your procedure, then you may not have a normal bowel movement for a few days.  DIET: Your first meal following the procedure should be a light meal and then it is ok to progress to your normal diet.  A half-sandwich or bowl of soup is an example of a good first meal.  Heavy or fried foods are harder to digest and may make you feel nauseous or bloated.  Likewise meals heavy in dairy and vegetables can cause extra gas to form and this can also increase the bloating.  Drink plenty of fluids but you should avoid alcoholic beverages for 24 hours.  ACTIVITY: Your care partner should take you home directly after the procedure.  You should plan to take it easy, moving slowly for the rest of the day.  You can resume normal activity the day after the procedure however you should NOT DRIVE or use heavy machinery for 24 hours (because of the sedation medicines used during the test).    SYMPTOMS TO REPORT IMMEDIATELY: A gastroenterologist can be reached at any hour.  During normal business hours, 8:30 AM to 5:00 PM Monday through Friday,  call [phone].  After hours and on weekends, please call the GI answering service at [phone] who will take a message and have the physician on call contact you.   Following lower endoscopy (colonoscopy or flexible sigmoidoscopy):  Excessive amounts of blood in the stool  Significant tenderness or worsening of abdominal pains  Swelling of the abdomen that is new, acute  Fever of 100F or higher  Following upper endoscopy (EGD)  Vomiting of blood or coffee ground material  New chest pain or pain under the shoulder blades  Painful or persistently difficult swallowing  New shortness of breath  Fever of 100F or higher  Black, tarry-looking stools  FOLLOW UP: If any biopsies were taken you will be contacted by phone or by letter within the next 1-3 weeks.  Call your gastroenterologist if you have not heard about the biopsies in 3 weeks.  Our staff will call the home number listed on your records the next business day following your procedure to check on you and address any questions or concerns that you may have at that time regarding the information given to you following your procedure. This is a courtesy call and so if there is no answer at the home number and we have not heard from you through the emergency physician on call, we will assume that you have returned to your regular daily activities without incident.  SIGNATURES/CONFIDENTIALITY: You and/or your care   partner have signed paperwork which will be entered into your electronic medical record.  These signatures attest to the fact that that the information above on your After Visit Summary has been reviewed and is understood.  Full responsibility of the confidentiality of this discharge information lies with you and/or your care-partner.  

## 2011-09-18 NOTE — Op Note (Signed)
[facility] [address] [place], [place]  [address]  COLONOSCOPY PROCEDURE REPORT  PATIENT:  Thomas Ortega, Thomas Ortega  MR#:  [id number] BIRTHDATE:  01-Oct-1960, 50 yrs. old  GENDER:  male ENDOSCOPIST:  Rachael Fee, MD REF. BY:  Charlynn Court, M.D. PROCEDURE DATE:  09/18/2011 PROCEDURE:  Colonoscopy 62130 ASA CLASS:  Class II INDICATIONS:  constipation, lower abd pains MEDICATIONS:   Fentanyl 75 mcg IV, These medications were titrated to patient response per physician's verbal order, Versed 8 mg IV  DESCRIPTION OF PROCEDURE:   After the risks benefits and alternatives of the procedure were thoroughly explained, informed consent was obtained.  Digital rectal exam was performed and revealed no rectal masses.   The LB [id number] endoscope was introduced through the anus and advanced to the cecum, which was identified by both the appendix and ileocecal valve, without limitations.  The quality of the prep was good..  The instrument was then slowly withdrawn as the colon was fully examined. <<PROCEDUREIMAGES>> FINDINGS:  A normal appearing cecum, ileocecal valve, and appendiceal orifice were identified. The ascending, hepatic flexure, transverse, splenic flexure, descending, sigmoid colon, and rectum appeared unremarkable (see image1, image2, and image3). Retroflexed views in the rectum revealed no abnormalities. ENDOSCOPIC IMPRESSION: 1) Normal colon 2) No polyps or cancers  RECOMMENDATIONS: 1) You should continue to follow colorectal cancer screening guidelines for "routine risk" patients with a repeat colonoscopy in 10 years. There is no need for FOBT (stool) testing for at least 5 years. 2) You should try once daily over the counter fiber supplement (such as citrucel or metamucil) for your mild constipation  REPEAT EXAM:  10 years  ______________________________ Rachael Fee, MD  n. eSIGNED:   Rachael Fee at 09/18/2011 02:41 PM  Enzo Montgomery,  [phone]

## 2011-09-18 NOTE — Op Note (Signed)
[facility] [address] [place], [place]  [address]  ENDOSCOPY PROCEDURE REPORT  PATIENT:  Thomas, Ortega  MR#:  [id number] BIRTHDATE:  1960/05/23, 50 yrs. old  GENDER:  male ENDOSCOPIST:  Rachael Fee, MD Referred by:  Charlynn Court, M.D. PROCEDURE DATE:  09/18/2011 PROCEDURE:  EGD with biopsy, 43239 ASA CLASS:  Class II INDICATIONS:  nausea MEDICATIONS:  These medications were titrated to patient response per physician's verbal order, Versed 1 mg IV, There was residual sedation effect present from prior procedure. TOPICAL ANESTHETIC:  none  DESCRIPTION OF PROCEDURE:   After the risks benefits and alternatives of the procedure were thoroughly explained, informed consent was obtained.  The LB [id number] [id number] endoscope was introduced through the mouth and advanced to the second portion of the duodenum, without limitations.  The instrument was slowly withdrawn as the mucosa was fully examined. <<PROCEDUREIMAGES>> There was mild, non-specific gastritis. Biopsies taken and sent to pathology (jar 1) (see image1).  Otherwise the examination was normal (see image2, image3, image5, image6, and image7). Retroflexed views revealed no abnormalities.    The scope was then withdrawn from the patient and the procedure completed. COMPLICATIONS:  None  ENDOSCOPIC IMPRESSION: 1) Mild gastritis, biopsied to check for H. pylori 2) Otherwise normal examination  RECOMMENDATIONS: If biopsies show H. pylori, you will be started on appropriate antibiotics.  ______________________________ Rachael Fee, MD  n. eSIGNED:   Rachael Fee at 09/18/2011 02:54 PM  Enzo Montgomery, [phone]

## 2011-09-18 NOTE — Progress Notes (Signed)
Patient did not experience any of the following events: a burn prior to discharge; a fall within the facility; wrong site/side/patient/procedure/implant event; or a hospital transfer or hospital admission upon discharge from the facility. (G8907) Patient did not have preoperative order for IV antibiotic SSI prophylaxis. (G8918)  

## 2011-09-19 ENCOUNTER — Telehealth: Payer: Self-pay | Admitting: *Deleted

## 2011-09-24 ENCOUNTER — Telehealth: Payer: Self-pay | Admitting: Gastroenterology

## 2011-09-24 ENCOUNTER — Telehealth: Payer: Self-pay | Admitting: Internal Medicine

## 2011-09-24 MED ORDER — ONDANSETRON HCL 8 MG PO TABS: 8.0000 mg | ORAL_TABLET | Freq: Three times a day (TID) | ORAL | Status: AC | PRN

## 2011-09-24 NOTE — Telephone Encounter (Signed)
Rx done; patient's wife informed/SLS

## 2011-09-24 NOTE — Telephone Encounter (Signed)
Pt notified that results are not available we will call after reviewing

## 2011-09-24 NOTE — Telephone Encounter (Signed)
Thomas Ortega 09/24/2011 9:52 AM Signed  Patient's wife called , patient having a lot of nausea, had an endoscopy last week please call [phone]  Spoke w/patient's wife; they cannot get Dr. Larae Grooms office to return call w/endo & colonoscopy results and would like to inquire as to if you could give them any information on results, explained that this usually comes from treating MDs office but that I would send msge to PCP and instructed pt's wife to call Dr. Christella Hartigan office again. Also, patient would like to get Rx for nausea; please advise/SLS

## 2011-09-24 NOTE — Telephone Encounter (Signed)
Patient's wife called , patient having a lot of nausea, had an endoscopy last week please call  [phone]

## 2011-09-24 NOTE — Telephone Encounter (Signed)
Colonoscopy was nl- doesn't need another for approximately 10 years. Upper endoscopy shows mild gastritis (irritated stomach lining)- they are waiting on a test to come back to see if there is a bacterial infection. Can call in zofran 8mg  po tid prn n/v #20 if not allergic and no interactions. Still recommend talking to GI to get their take on the results.

## 2011-09-24 NOTE — Telephone Encounter (Signed)
Left message on machine to call back  

## 2011-09-25 ENCOUNTER — Encounter: Payer: Self-pay | Admitting: Gastroenterology

## 2011-10-02 ENCOUNTER — Telehealth: Payer: Self-pay

## 2011-10-02 DIAGNOSIS — R110 Nausea: Secondary | ICD-10-CM

## 2011-10-02 NOTE — Telephone Encounter (Signed)
Pt needs to come in and have labs for the hep c clinic hep b surface antigen and fax results to [phone]  Pt aware

## 2011-10-03 ENCOUNTER — Other Ambulatory Visit: Payer: BC Managed Care – PPO

## 2011-10-03 DIAGNOSIS — B181 Chronic viral hepatitis B without delta-agent: Secondary | ICD-10-CM

## 2011-10-03 NOTE — Telephone Encounter (Signed)
Pt came in because he is complaining of nausea all the time and he was given zofran by his PCP and it has not helped at all.  He is here to get labs today and referral has been made to the Hep C clinic they are are waiting on the lab results from today.  Please advice

## 2011-10-03 NOTE — Telephone Encounter (Signed)
egd last month was pretty underwhelming and biopsies show no h. Pylori.  Unclear what is cuasing his nausea but recent Hb a1c was in 9s.  Perhaps he has DM gastroparesis.  Advise that he eat 4-5 small meals a day for now and he needs gastric emptying scan

## 2011-10-03 NOTE — Telephone Encounter (Signed)
Pt notified [facility] please arrive on 10/11/11 745 am arrival NPO midnight pt aware and has been given recommendations from Dr Christella Hartigan

## 2011-10-04 LAB — HEPATITIS B SURFACE ANTIGEN: Hepatitis B Surface Ag: NEGATIVE

## 2011-10-05 ENCOUNTER — Encounter (HOSPITAL_COMMUNITY): Payer: Self-pay | Admitting: Emergency Medicine

## 2011-10-05 ENCOUNTER — Emergency Department (HOSPITAL_COMMUNITY): Payer: BC Managed Care – PPO

## 2011-10-05 ENCOUNTER — Emergency Department (HOSPITAL_COMMUNITY)
Admission: EM | Admit: 2011-10-05 | Discharge: 2011-10-05 | Disposition: A | Discharge status: Home / Self Care | Payer: BC Managed Care – PPO | Attending: Emergency Medicine | Admitting: Emergency Medicine

## 2011-10-05 DIAGNOSIS — M545 Low back pain, unspecified: Secondary | ICD-10-CM | POA: Insufficient documentation

## 2011-10-05 DIAGNOSIS — E119 Type 2 diabetes mellitus without complications: Secondary | ICD-10-CM | POA: Insufficient documentation

## 2011-10-05 DIAGNOSIS — I10 Essential (primary) hypertension: Secondary | ICD-10-CM | POA: Insufficient documentation

## 2011-10-05 DIAGNOSIS — M25559 Pain in unspecified hip: Secondary | ICD-10-CM | POA: Insufficient documentation

## 2011-10-05 DIAGNOSIS — M549 Dorsalgia, unspecified: Secondary | ICD-10-CM

## 2011-10-05 DIAGNOSIS — Z79899 Other long term (current) drug therapy: Secondary | ICD-10-CM | POA: Insufficient documentation

## 2011-10-05 MED ORDER — OXYCODONE-ACETAMINOPHEN 5-325 MG PO TABS: 1.0000 | ORAL_TABLET | Freq: Four times a day (QID) | ORAL | Status: AC | PRN

## 2011-10-05 NOTE — ED Notes (Signed)
Pt with right sided back pain which started last night.  Pt reports he fell down six steps on Wed but did not see a doctor then because he had some pain medication that another doctor had given him for back pain.  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

## 2011-10-05 NOTE — ED Provider Notes (Signed)
History     CSN: [id number]  Arrival date & time 10/05/11  1704   First MD Initiated Contact with Patient 10/05/11 1837      Chief Complaint  Patient presents with  . Back Pain    (Consider location/radiation/quality/duration/timing/severity/associated sxs/prior treatment) HPI  51 year old male with history of chronic back pain, diabetes, hepatitis, presents complaining of back pain. Patient reports that he actually fell down 6 steps several days ago. States he landed on his buttocks but didn't have any significant pain at that time He denies hitting head, loss of consciousness at that time. Yesterday he notice increasing pain to his low back and his right hip. Describe pain is sharp sensation that is worsening with sitting, lying, and walking. Pain is nonradiating. He reports no urinary or bowel incontinence, bleeding, dislocations, or rash. He has tried taking leftover pain medication at home including Roxicodone, and Flexeril however it did not provide any adequate relief.   Past Medical History  Diagnosis Date  . Diabetes mellitus   . Hypertension   . Hepatitis B     Past Surgical History  Procedure Date  . Mass excision 2009    base of skull    Family History  Problem Relation Age of Onset  . Prostate cancer Neg Hx   . Colon cancer Neg Hx   . Breast cancer Neg Hx   . Heart disease Neg Hx   . Diabetes Neg Hx     History  Substance Use Topics  . Smoking status: Never Smoker   . Smokeless tobacco: Never Used  . Alcohol Use: No      Review of Systems  All other systems reviewed and are negative.    Allergies  Review of patient's allergies indicates no known allergies.  Home Medications   Current Outpatient Rx  Name Route Sig Dispense Refill  . AMLODIPINE BESYLATE 10 MG PO TABS Oral Take 1 tablet (10 mg total) by mouth daily. 30 tablet 6  . ONETOUCH ULTRA SYSTEM W/DEVICE KIT  Use as instructed to check blood sugar once a day Dx Code 250.00 1 each 0  .  CYCLOBENZAPRINE HCL 10 MG PO TABS Oral Take 10 mg by mouth 3 (three) times daily as needed. Muscle Spasms    . GLUCOSE BLOOD VI STRP  Use as instructed to check blood sugar once a day Dx Code 250.00 100 each 3  . ONETOUCH ULTRASOFT LANCETS MISC  Use as instructed to check blood sugar once a day Dx Code 250.00 100 each 3  . METFORMIN HCL 1000 MG PO TABS Oral Take 1 tablet (1,000 mg total) by mouth 2 (two) times daily with a meal. 60 tablet 6    Pharmacy change from [facility] per patient request  . OXYCODONE HCL 15 MG PO TABS      . SITAGLIPTIN PHOSPHATE 100 MG PO TABS Oral Take 1 tablet (100 mg total) by mouth daily. 30 tablet 6    BP 125/83  Pulse 100  Temp 98.6 F (37 C) (Oral)  Resp 16  SpO2 100%  Physical Exam  Nursing note and vitals reviewed. Constitutional: He appears well-developed and well-nourished. No distress.  HENT:  Head: Atraumatic.  Eyes: Conjunctivae are normal.  Neck: Neck supple.  Abdominal: Soft. There is no tenderness.  Musculoskeletal:       Right hip: He exhibits decreased range of motion and tenderness. He exhibits normal strength, no bony tenderness and no swelling.       Left hip: Normal.  Right knee: Normal.       Left knee: Normal.       Cervical back: Normal.       Thoracic back: Normal.       Lumbar back: He exhibits decreased range of motion, tenderness and bony tenderness. He exhibits no swelling, no edema and no deformity.       Increasing low back pain with straight leg raise bilaterally. Tenderness to right lateral aspects of right hip on palpation without overlying skin changes or deformity noted.  Patellar deep tendon reflex 2+ bilaterally. No foot drop. Sensation to inner thigh is intact to light touch.  Neurological: He is alert.  Skin: Skin is warm. No rash noted.    ED Course  Procedures (including critical care time)  Labs Reviewed - No data to display No results found.   No diagnosis found.  8:20 PM Results for orders  placed in visit on 10/03/11  HEPATITIS B SURFACE ANTIGEN      Component Value Range   Hepatitis B Surface Ag NEGATIVE  NEGATIVE   Dg Lumbar Spine Complete  10/05/2011  *RADIOLOGY REPORT*  Clinical Data: Low back pain radiating to the right side and hip.  LUMBAR SPINE - COMPLETE 4+ VIEW  Comparison: MR lumbar spine 08/23/2011 and lumbar spine series 08/09/2011.  Findings: Alignment is anatomic.  Vertebral body height is maintained.  Very minimal endplate degenerative changes in the mid and lower lumbar spine.  Loss of disc space height at L5-S1.  No definite pars defects.  IMPRESSION: Mild spondylosis, worst at L5-S1.  Original Report Authenticated By: Reyes Ivan, M.D.    1. Low back pain 2. Right hip pain  MDM  Aggravating of low back pain after fall. No midline spine tenderness noted. Will x-ray low back for further evaluation.  8:20 PM Xray of Lspine shows mild spondilosis but no acute fracture or dislocation.  Since pt has pain medication and muscle relaxant, i will prescribe a short course of steroid and referral to ortho. Care instruction given.       Fayrene Helper, PA-C 10/05/11 2021

## 2011-10-06 NOTE — ED Provider Notes (Signed)
Medical screening examination/treatment/procedure(s) were performed by non-physician practitioner and as supervising physician I was immediately available for consultation/collaboration.  Ajax Schroll, MD 10/06/11 2101 

## 2011-10-11 ENCOUNTER — Other Ambulatory Visit (HOSPITAL_COMMUNITY): Payer: BC Managed Care – PPO

## 2011-10-25 ENCOUNTER — Encounter (HOSPITAL_COMMUNITY): Payer: Self-pay

## 2011-10-25 ENCOUNTER — Encounter (HOSPITAL_COMMUNITY)
Admission: RE | Admit: 2011-10-25 | Discharge: 2011-10-25 | Disposition: A | Discharge status: Home / Self Care | Payer: BC Managed Care – PPO | Source: Ambulatory Visit | Attending: Gastroenterology | Admitting: Gastroenterology

## 2011-10-25 DIAGNOSIS — R110 Nausea: Secondary | ICD-10-CM | POA: Insufficient documentation

## 2011-10-25 DIAGNOSIS — K219 Gastro-esophageal reflux disease without esophagitis: Secondary | ICD-10-CM | POA: Insufficient documentation

## 2011-10-25 DIAGNOSIS — R142 Eructation: Secondary | ICD-10-CM | POA: Insufficient documentation

## 2011-10-25 DIAGNOSIS — R141 Gas pain: Secondary | ICD-10-CM | POA: Insufficient documentation

## 2011-10-25 HISTORY — DX: Nausea: R11.0

## 2011-10-25 MED ORDER — TECHNETIUM TC 99M SULFUR COLLOID
2.0000 | Freq: Once | INTRAVENOUS | Status: AC | PRN
  Administered 2011-10-25: 2 via INTRAVENOUS

## 2011-10-30 ENCOUNTER — Ambulatory Visit: Payer: BC Managed Care – PPO | Admitting: Internal Medicine

## 2011-11-05 ENCOUNTER — Telehealth: Payer: Self-pay | Admitting: Gastroenterology

## 2011-11-06 NOTE — Telephone Encounter (Signed)
Pt scheduled for ROV 12/07/11  Pt is aware

## 2011-12-04 ENCOUNTER — Ambulatory Visit: Payer: BC Managed Care – PPO | Admitting: Gastroenterology

## 2012-07-16 ENCOUNTER — Telehealth: Payer: Self-pay | Admitting: Internal Medicine

## 2012-07-16 NOTE — Telephone Encounter (Signed)
Refill- glucophage 1000mg  tab. Take one tablet by mouth twice daily with meals. Qty 60 last fill 3.21.14  Refill- amlodipine 10mg  tab. Take one tablet by mouth every day. Qty 60 last fill 3.21.14

## 2012-07-16 NOTE — Telephone Encounter (Signed)
Denying medication. Pt needs an appt and I tried both numbers and cant get ahold of pt. Last refills stated not to go to [facility]?

## 2012-08-15 ENCOUNTER — Other Ambulatory Visit: Payer: Self-pay | Admitting: Internal Medicine

## 2012-08-15 NOTE — Telephone Encounter (Signed)
Pt is past due for follow up. Please call pt to arrange appt as we will not be able to provider further refills until he can be seen. 30 day supply sent of metformin and amlodipine.

## 2012-08-15 NOTE — Telephone Encounter (Signed)
Could not reach or leave a message at all for patient. We may want to send a letter.

## 2012-08-15 NOTE — Telephone Encounter (Signed)
Letter mailed to pt to call and schedule appt.  

## 2012-08-15 NOTE — Telephone Encounter (Signed)
Rx's sent into pharmacy.  

## 2012-09-16 ENCOUNTER — Encounter: Payer: Self-pay | Admitting: Family

## 2012-09-16 ENCOUNTER — Ambulatory Visit (INDEPENDENT_AMBULATORY_CARE_PROVIDER_SITE_OTHER): Payer: BC Managed Care – PPO | Admitting: Family

## 2012-09-16 VITALS — BP 147/100 | HR 64 | Temp 98.0°F | Resp 16 | Ht 72.0 in | Wt 234.1 lb

## 2012-09-16 DIAGNOSIS — M549 Dorsalgia, unspecified: Secondary | ICD-10-CM

## 2012-09-16 DIAGNOSIS — E119 Type 2 diabetes mellitus without complications: Secondary | ICD-10-CM

## 2012-09-16 DIAGNOSIS — E785 Hyperlipidemia, unspecified: Secondary | ICD-10-CM

## 2012-09-16 DIAGNOSIS — I10 Essential (primary) hypertension: Secondary | ICD-10-CM

## 2012-09-16 LAB — HEMOGLOBIN A1C
Hgb A1c MFr Bld: 6 % — ABNORMAL HIGH (ref <5.7)
Mean Plasma Glucose: 126 mg/dL — ABNORMAL HIGH (ref <117)

## 2012-09-16 LAB — HEPATIC FUNCTION PANEL
AST: 24 U/L (ref 0–37)
Albumin: 4.6 g/dL (ref 3.5–5.2)
Bilirubin, Direct: 0.1 mg/dL (ref 0.0–0.3)
Total Bilirubin: 0.5 mg/dL (ref 0.3–1.2)

## 2012-09-16 LAB — LIPID PANEL
HDL: 37 mg/dL — ABNORMAL LOW (ref >39)
LDL Cholesterol: 122 mg/dL — ABNORMAL HIGH (ref 0–99)
Total CHOL/HDL Ratio: 4.8 Ratio

## 2012-09-16 MED ORDER — LISINOPRIL 10 MG PO TABS: 10.0000 mg | ORAL_TABLET | Freq: Every day | ORAL | Status: DC

## 2012-09-16 MED ORDER — METFORMIN HCL 1000 MG PO TABS: ORAL_TABLET | ORAL | Status: DC

## 2012-09-16 NOTE — Patient Instructions (Addendum)
Please complete your lab work prior to leaving. Follow up in 1 month.  

## 2012-09-16 NOTE — Assessment & Plan Note (Signed)
Deteriorated, start lisinopril.  Plan follow up in 1 month for BP check and BMET.

## 2012-09-16 NOTE — Progress Notes (Signed)
Subjective:    Patient ID: Thomas Ortega, male    DOB: 11/22/1960, 52 y.o.   MRN: [id number]  HPI  Thomas Ortega is a 52 yr old male who presents today for follow up. He has been without insurance untail recently.   1) DM2-currently maintained on metformin. Was unable to afford [place].  Last A1C was 9.2 back 07/17/11.  He has been out of metformin for 2 months as well.  Not checking sugar at home.  His last eye exam was 1 year ago.  He does report + polyuria when he is off of his metformin.   2) HTN-ran ou of amlodipine 2 months ago.  Denies HA  3) Back pain/neck pain- saw neurosurgery- told that surgery would not help him, could not afford PTN.      Review of Systems See HPI  Past Medical History  Diagnosis Date  . Diabetes mellitus   . Hypertension   . Hepatitis B   . Nausea     History   Social History  . Marital Status: Married    Spouse Name: N/A    Number of Children: 1  . Years of Education: N/A   Occupational History  . unemployed    Social History Main Topics  . Smoking status: Never Smoker   . Smokeless tobacco: Never Used  . Alcohol Use: No  . Drug Use: No  . Sexually Active: Not on file   Other Topics Concern  . Not on file   Social History Narrative  . No narrative on file    Past Surgical History  Procedure Laterality Date  . Mass excision  2009    base of skull    Family History  Problem Relation Age of Onset  . Prostate cancer Neg Hx   . Colon cancer Neg Hx   . Breast cancer Neg Hx   . Heart disease Neg Hx   . Diabetes Neg Hx     No Known Allergies  Current Outpatient Prescriptions on File Prior to Visit  Medication Sig Dispense Refill  . amLODipine (NORVASC) 10 MG tablet TAKE ONE TABLET BY MOUTH EVERY DAY  60 tablet  0  . Blood Glucose Monitoring Suppl (ONE TOUCH ULTRA SYSTEM KIT) W/DEVICE KIT Use as instructed to check blood sugar once a day Dx Code 250.00  1 each  0  . glucose blood (ONE TOUCH ULTRA TEST) test strip Use as  instructed to check blood sugar once a day Dx Code 250.00  100 each  3  . Lancets (ONETOUCH ULTRASOFT) lancets Use as instructed to check blood sugar once a day Dx Code 250.00  100 each  3  . metFORMIN (GLUCOPHAGE) 1000 MG tablet TAKE ONE TABLET BY MOUTH TWICE DAILY WITH MEALS  60 tablet  0  . cyclobenzaprine (FLEXERIL) 10 MG tablet Take 10 mg by mouth 3 (three) times daily as needed. Muscle Spasms      . sitaGLIPtin (JANUVIA) 100 MG tablet Take 1 tablet (100 mg total) by mouth daily.  30 tablet  6   No current facility-administered medications on file prior to visit.    BP 147/100  Pulse 64  Temp(Src) 98 F (36.7 C) (Oral)  Resp 16  Ht 6' (1.829 m)  Wt 234 lb 1.9 oz (106.196 kg)  BMI 31.75 kg/m2  SpO2 97%       Objective:   Physical Exam  Constitutional: He is oriented to person, place, and time. He appears well-developed and well-nourished. No distress.  HENT:  Head: Normocephalic and atraumatic.  Cardiovascular: Normal rate and regular rhythm.   No murmur heard. Pulmonary/Chest: Effort normal and breath sounds normal. No respiratory distress. He has no wheezes. He has no rales. He exhibits no tenderness.  Musculoskeletal: He exhibits no edema.  Neurological: He is alert and oriented to person, place, and time.  Psychiatric: He has a normal mood and affect. His behavior is normal. Judgment and thought content normal.          Assessment & Plan:

## 2012-09-16 NOTE — Assessment & Plan Note (Signed)
We discussed PT referral but he is unable to afford. Per pt, neurosurgery did not have any surgical options for him.  Monitor.

## 2012-09-16 NOTE — Assessment & Plan Note (Signed)
Resume metformin, add ACE, aspirin, check a1c, urine microalbumin, bmet.

## 2012-09-17 ENCOUNTER — Encounter: Payer: Self-pay | Admitting: Family

## 2012-09-17 LAB — BASIC METABOLIC PANEL WITH GFR
CO2: 28 mEq/L (ref 19–32)
Calcium: 9.5 mg/dL (ref 8.4–10.5)
Chloride: 104 mEq/L (ref 96–112)
Creat: 1.19 mg/dL (ref 0.50–1.35)
GFR, Est Non African American: 71 mL/min
Sodium: 138 mEq/L (ref 135–145)

## 2012-09-17 LAB — MICROALBUMIN / CREATININE URINE RATIO: Microalb Creat Ratio: 2.1 mg/g (ref 0.0–30.0)

## 2012-10-02 ENCOUNTER — Other Ambulatory Visit: Payer: Self-pay

## 2012-10-14 ENCOUNTER — Encounter: Payer: Self-pay | Admitting: Family

## 2012-10-14 ENCOUNTER — Ambulatory Visit (INDEPENDENT_AMBULATORY_CARE_PROVIDER_SITE_OTHER): Payer: BC Managed Care – PPO | Admitting: Family

## 2012-10-14 VITALS — BP 150/92 | HR 84 | Temp 98.1°F | Resp 16 | Ht 72.0 in | Wt 234.1 lb

## 2012-10-14 DIAGNOSIS — E119 Type 2 diabetes mellitus without complications: Secondary | ICD-10-CM

## 2012-10-14 DIAGNOSIS — K2990 Gastroduodenitis, unspecified, without bleeding: Secondary | ICD-10-CM

## 2012-10-14 DIAGNOSIS — I10 Essential (primary) hypertension: Secondary | ICD-10-CM

## 2012-10-14 DIAGNOSIS — K2970 Gastritis, unspecified, without bleeding: Secondary | ICD-10-CM

## 2012-10-14 LAB — BASIC METABOLIC PANEL
CO2: 24 mEq/L (ref 19–32)
Chloride: 105 mEq/L (ref 96–112)
Creat: 1.24 mg/dL (ref 0.50–1.35)

## 2012-10-14 MED ORDER — OMEPRAZOLE 40 MG PO CPDR: 40.0000 mg | DELAYED_RELEASE_CAPSULE | Freq: Every day | ORAL | Status: DC

## 2012-10-14 MED ORDER — HYDROCHLOROTHIAZIDE 25 MG PO TABS: 25.0000 mg | ORAL_TABLET | Freq: Every day | ORAL | Status: DC

## 2012-10-14 MED ORDER — LISINOPRIL 10 MG PO TABS: 10.0000 mg | ORAL_TABLET | Freq: Every day | ORAL | Status: DC

## 2012-10-14 MED ORDER — GLUCOSE BLOOD VI STRP: ORAL_STRIP | Status: DC

## 2012-10-14 NOTE — Progress Notes (Signed)
Subjective:    Patient ID: Thomas Ortega, male    DOB: Nov 26, 1960, 52 y.o.   MRN: [id number]  HPI  Thomas Ortega is a 52 yr old male who presents today for follow up.  1) HTN-  Last visit lisinopril was added to his regimen. Reports + compliance with lisinopril  2) DM2- back on metformin. Denies GI side effects.  Not checking sugars as test strips were not covered.  3) Nausea- he has hx of GERD. Reports that he has had some reflux symptoms.  He denies abdominal pain.       Review of Systems See HPI  Past Medical History  Diagnosis Date  . Diabetes mellitus   . Hypertension   . Hepatitis B   . Nausea     History   Social History  . Marital Status: Married    Spouse Name: N/A    Number of Children: 1  . Years of Education: N/A   Occupational History  . unemployed    Social History Main Topics  . Smoking status: Never Smoker   . Smokeless tobacco: Never Used  . Alcohol Use: No  . Drug Use: No  . Sexually Active: Not on file   Other Topics Concern  . Not on file   Social History Narrative   Originally from [place]   Currently unemployed   Has 2 grown children who still live in [place]    Past Surgical History  Procedure Laterality Date  . Mass excision  2009    base of skull    Family History  Problem Relation Age of Onset  . Prostate cancer Neg Hx   . Colon cancer Neg Hx   . Breast cancer Neg Hx   . Heart disease Neg Hx   . Diabetes Neg Hx     No Known Allergies  Current Outpatient Prescriptions on File Prior to Visit  Medication Sig Dispense Refill  . aspirin 81 MG tablet Take 81 mg by mouth daily.      Marland Kitchen lisinopril (PRINIVIL,ZESTRIL) 10 MG tablet Take 1 tablet (10 mg total) by mouth daily.  30 tablet  5  . metFORMIN (GLUCOPHAGE) 1000 MG tablet TAKE ONE TABLET BY MOUTH TWICE DAILY WITH MEALS  60 tablet  5  . Blood Glucose Monitoring Suppl (ONE TOUCH ULTRA SYSTEM KIT) W/DEVICE KIT Use as instructed to check blood sugar once a day Dx Code 250.00  1  each  0  . glucose blood (ONE TOUCH ULTRA TEST) test strip Use as instructed to check blood sugar once a day Dx Code 250.00  100 each  3  . Lancets (ONETOUCH ULTRASOFT) lancets Use as instructed to check blood sugar once a day Dx Code 250.00  100 each  3   No current facility-administered medications on file prior to visit.    BP 150/92  Pulse 84  Temp(Src) 98.1 F (36.7 C) (Oral)  Resp 16  Ht 6' (1.829 m)  Wt 234 lb 1.3 oz (106.178 kg)  BMI 31.74 kg/m2  SpO2 98%       Objective:   Physical Exam  Constitutional: He is oriented to person, place, and time. He appears well-developed and well-nourished. No distress.  Cardiovascular: Normal rate and regular rhythm.   No murmur heard. Pulmonary/Chest: Effort normal and breath sounds normal. No respiratory distress. He has no wheezes. He has no rales. He exhibits no tenderness.  Musculoskeletal: He exhibits no edema.  Neurological: He is alert and oriented to person, place, and time.  Psychiatric: He has a normal mood and affect. His behavior is normal. Judgment and thought content normal.          Assessment & Plan:

## 2012-10-14 NOTE — Assessment & Plan Note (Signed)
Likely cause for nausea, will add PPI.

## 2012-10-14 NOTE — Patient Instructions (Addendum)
Please follow up in 1 month.  

## 2012-10-14 NOTE — Assessment & Plan Note (Signed)
BP remains elevated. Continue lisinopril, add HCTZ, obtain bmet.

## 2012-10-14 NOTE — Assessment & Plan Note (Signed)
A1C is 6.0.

## 2012-10-15 ENCOUNTER — Telehealth: Payer: Self-pay | Admitting: *Deleted

## 2012-10-15 ENCOUNTER — Encounter: Payer: Self-pay | Admitting: Family

## 2012-10-15 MED ORDER — OMEPRAZOLE 40 MG PO CPDR: 40.0000 mg | DELAYED_RELEASE_CAPSULE | Freq: Every day | ORAL | Status: DC

## 2012-10-15 MED ORDER — LISINOPRIL 10 MG PO TABS: 10.0000 mg | ORAL_TABLET | Freq: Every day | ORAL | Status: DC

## 2012-10-15 MED ORDER — HYDROCHLOROTHIAZIDE 25 MG PO TABS: 25.0000 mg | ORAL_TABLET | Freq: Every day | ORAL | Status: DC

## 2012-10-15 NOTE — Addendum Note (Signed)
Addended by: Mervin Kung A on: 10/15/2012 12:00 PM   Modules accepted: Orders

## 2012-10-15 NOTE — Telephone Encounter (Signed)
Opened in error

## 2012-10-16 ENCOUNTER — Telehealth: Payer: Self-pay | Admitting: *Deleted

## 2012-10-16 NOTE — Telephone Encounter (Signed)
Received notice that eRx transmission for lisinopril and omeprazole had failed. Rxs called to [facility].

## 2012-11-18 ENCOUNTER — Ambulatory Visit: Payer: BC Managed Care – PPO | Admitting: Family

## 2012-11-18 DIAGNOSIS — Z0289 Encounter for other administrative examinations: Secondary | ICD-10-CM

## 2013-02-26 ENCOUNTER — Emergency Department (HOSPITAL_COMMUNITY)
Admission: EM | Admit: 2013-02-26 | Discharge: 2013-02-27 | Disposition: A | Discharge status: Home / Self Care | Payer: BC Managed Care – PPO | Attending: Emergency Medicine | Admitting: Emergency Medicine

## 2013-02-26 ENCOUNTER — Encounter (HOSPITAL_COMMUNITY): Payer: Self-pay | Admitting: Emergency Medicine

## 2013-02-26 DIAGNOSIS — M545 Low back pain, unspecified: Secondary | ICD-10-CM | POA: Insufficient documentation

## 2013-02-26 DIAGNOSIS — Z7982 Long term (current) use of aspirin: Secondary | ICD-10-CM | POA: Insufficient documentation

## 2013-02-26 DIAGNOSIS — B1910 Unspecified viral hepatitis B without hepatic coma: Secondary | ICD-10-CM | POA: Insufficient documentation

## 2013-02-26 DIAGNOSIS — R059 Cough, unspecified: Secondary | ICD-10-CM | POA: Insufficient documentation

## 2013-02-26 DIAGNOSIS — I10 Essential (primary) hypertension: Secondary | ICD-10-CM | POA: Insufficient documentation

## 2013-02-26 DIAGNOSIS — Z79899 Other long term (current) drug therapy: Secondary | ICD-10-CM | POA: Insufficient documentation

## 2013-02-26 DIAGNOSIS — E119 Type 2 diabetes mellitus without complications: Secondary | ICD-10-CM | POA: Insufficient documentation

## 2013-02-26 DIAGNOSIS — R05 Cough: Secondary | ICD-10-CM | POA: Insufficient documentation

## 2013-02-26 DIAGNOSIS — M549 Dorsalgia, unspecified: Secondary | ICD-10-CM

## 2013-02-26 NOTE — ED Notes (Addendum)
Patient is alert and oriented x3.  He is complaining of right flank pain that he has been dealing with for over a month.  Patient states that he has been having back pain since 2011 due to a work related injury. He add that this pain has  Been increasing over the last week to the point that he is finding it difficult to walk.  Currently he rates his pain 10 of 10. Patient has additional complaints of left hand numbness and difficulty swallowing.

## 2013-02-27 ENCOUNTER — Emergency Department (HOSPITAL_COMMUNITY): Payer: BC Managed Care – PPO

## 2013-02-27 LAB — URINALYSIS, ROUTINE W REFLEX MICROSCOPIC
Bilirubin Urine: NEGATIVE
Hgb urine dipstick: NEGATIVE
Specific Gravity, Urine: 1.033 — ABNORMAL HIGH (ref 1.005–1.030)
Urobilinogen, UA: 1 mg/dL (ref 0.0–1.0)
pH: 6.5 (ref 5.0–8.0)

## 2013-02-27 MED ORDER — HYDROMORPHONE HCL PF 1 MG/ML IJ SOLN
1.0000 mg | Freq: Once | INTRAMUSCULAR | Status: AC
  Administered 2013-02-27: 1 mg via INTRAMUSCULAR
  Filled 2013-02-27: qty 1

## 2013-02-27 MED ORDER — GUAIFENESIN 100 MG/5ML PO SYRP: 100.0000 mg | ORAL_SOLUTION | ORAL | Status: DC | PRN

## 2013-02-27 MED ORDER — METHOCARBAMOL 500 MG PO TABS: 1000.0000 mg | ORAL_TABLET | Freq: Four times a day (QID) | ORAL | Status: DC

## 2013-02-27 MED ORDER — ONDANSETRON 4 MG PO TBDP
4.0000 mg | ORAL_TABLET | Freq: Once | ORAL | Status: AC
  Administered 2013-02-27: 4 mg via ORAL
  Filled 2013-02-27: qty 1

## 2013-02-27 MED ORDER — HYDROCODONE-ACETAMINOPHEN 5-325 MG PO TABS: ORAL_TABLET | ORAL | Status: DC

## 2013-02-27 MED ORDER — ALBUTEROL SULFATE HFA 108 (90 BASE) MCG/ACT IN AERS: 2.0000 | INHALATION_SPRAY | RESPIRATORY_TRACT | Status: DC | PRN

## 2013-02-27 MED ORDER — ALBUTEROL SULFATE HFA 108 (90 BASE) MCG/ACT IN AERS
INHALATION_SPRAY | RESPIRATORY_TRACT | Status: AC
  Filled 2013-02-27: qty 6.7

## 2013-02-27 NOTE — ED Provider Notes (Signed)
CSN: [id number]     Arrival date & time 02/26/13  2127 History   First MD Initiated Contact with Patient 02/27/13 0047     Chief Complaint  Patient presents with  . Flank Pain    right side   (Consider location/radiation/quality/duration/timing/severity/associated sxs/prior Treatment) HPI Comments: Patient with history of back pain presents with worsening right lower back pain for the past week. Previous MRI showed compression on the L5 nerve. Patient denies red flag signs and symptoms of lower back pain. He is taking over-the-counter medicines without relief. He states he had recent MRI and it due to follow-up for results in the next week. Patient also complains of cough for 3 weeks. He has had no fever or chest pain. Cough is worse at night. Patient states that he has mucus buildup in his throat. No treatments for this. Onset of symptoms gradual. Course is constant. Movement makes it worse.   Patient is a 52 y.o. male presenting with flank pain. The history is provided by the patient.  Flank Pain Associated symptoms include coughing. Pertinent negatives include no abdominal pain, chest pain, fever, headaches, myalgias, nausea, numbness, rash, sore throat, vomiting or weakness.    Past Medical History  Diagnosis Date  . Diabetes mellitus   . Hypertension   . Hepatitis B   . Nausea    Past Surgical History  Procedure Laterality Date  . Mass excision  2009    base of skull   Family History  Problem Relation Age of Onset  . Prostate cancer Neg Hx   . Colon cancer Neg Hx   . Breast cancer Neg Hx   . Heart disease Neg Hx   . Diabetes Neg Hx    History  Substance Use Topics  . Smoking status: Never Smoker   . Smokeless tobacco: Never Used  . Alcohol Use: No    Review of Systems  Constitutional: Negative for fever and unexpected weight change.  HENT: Negative for rhinorrhea and sore throat.   Eyes: Negative for redness.  Respiratory: Positive for cough. Negative for shortness  of breath.   Cardiovascular: Negative for chest pain.  Gastrointestinal: Negative for nausea, vomiting, abdominal pain, diarrhea and constipation.       Neg for fecal incontinence  Genitourinary: Negative for dysuria, hematuria, flank pain and difficulty urinating.       Negative for urinary incontinence or retention  Musculoskeletal: Positive for back pain. Negative for myalgias.  Skin: Negative for rash.  Neurological: Negative for weakness, numbness and headaches.       Negative for saddle paresthesias     Allergies  Review of patient's allergies indicates no known allergies.  Home Medications   Current Outpatient Rx  Name  Route  Sig  Dispense  Refill  . aspirin 81 MG tablet   Oral   Take 81 mg by mouth daily.         . hydrochlorothiazide (HYDRODIURIL) 25 MG tablet   Oral   Take 1 tablet (25 mg total) by mouth daily.   90 tablet   3   . lisinopril (PRINIVIL,ZESTRIL) 10 MG tablet   Oral   Take 1 tablet (10 mg total) by mouth daily.   30 tablet   5   . metFORMIN (GLUCOPHAGE) 1000 MG tablet      TAKE ONE TABLET BY MOUTH TWICE DAILY WITH MEALS   60 tablet   5   . omeprazole (PRILOSEC) 40 MG capsule   Oral   Take 1 capsule (  40 mg total) by mouth daily.   30 capsule   3   . HYDROcodone-acetaminophen (NORCO/VICODIN) 5-325 MG per tablet   Oral   Take 1 tablet by mouth once.          BP 139/87  Pulse 73  Temp(Src) 98.8 F (37.1 C) (Oral)  SpO2 99% Physical Exam  Nursing note and vitals reviewed. Constitutional: He appears well-developed and well-nourished.  HENT:  Head: Normocephalic and atraumatic.  Eyes: Conjunctivae are normal.  Neck: Normal range of motion.  Pulmonary/Chest: No respiratory distress. He has no wheezes. He has no rales.  Abdominal: Soft. There is no tenderness. There is no CVA tenderness.  Musculoskeletal: Normal range of motion.       Cervical back: Normal.       Thoracic back: Normal.       Lumbar back: He exhibits tenderness.  He exhibits no bony tenderness.       Back:  No step-off noted with palpation of spine.   Neurological: He is alert. He has normal reflexes. No sensory deficit. He exhibits normal muscle tone.  5/5 strength in entire lower extremities bilaterally. No sensation deficit.   Skin: Skin is warm and dry.  Psychiatric: He has a normal mood and affect.    ED Course  Procedures (including critical care time) Labs Review Labs Reviewed  URINALYSIS, ROUTINE W REFLEX MICROSCOPIC - Abnormal; Notable for the following:    Specific Gravity, Urine 1.033 (*)    All other components within normal limits   Imaging Review Dg Chest 2 View  02/27/2013   CLINICAL DATA:  Cough  EXAM: CHEST  2 VIEW  COMPARISON:  None.  FINDINGS: The heart size and mediastinal contours are within normal limits. Both lungs are clear. The visualized skeletal structures are unremarkable.  IMPRESSION: No active cardiopulmonary disease.   Electronically Signed   By: Jearld Lesch M.D.   On: 02/27/2013 02:11    EKG Interpretation   None      1:01 AM Patient seen and examined. Work-up initiated. Medications ordered.   Vital signs reviewed and are as follows: Filed Vitals:   02/26/13 2213  BP: 139/87  Pulse: 73  Temp: 98.8 F (37.1 C)   Pain medicine for reproducible back pain.   CXR for cough for 3 weeks.     Patient informed of UA and CXR results. He is feeling a bit better after medication, he is sitting up in bed.  Plan:  Back pain: Vicodin, Robaxin, conservative management. Cough: Chest x-ray negative, albuterol inhaler given, guaifenesin given.   No red flag s/s of low back pain. Patient was counseled on back pain precautions and told to do activity as tolerated but do not lift, push, or pull heavy objects more than 10 pounds for the next week.  Patient counseled to use ice or heat on back for no longer than 15 minutes every hour.   Patient prescribed muscle relaxer and counseled on proper use of muscle  relaxant medication.    Patient prescribed narcotic pain medicine and counseled on proper use of narcotic pain medications. Counseled not to combine this medication with others containing tylenol.   Urged patient not to drink alcohol, drive, or perform any other activities that requires focus while taking either of these medications.  Patient urged to follow-up with PCP if pain does not improve with treatment and rest or if pain becomes recurrent. Urged to return with worsening severe pain, loss of bowel or bladder control, trouble walking.  The patient verbalizes understanding and agrees with the plan.   MDM   1. Back pain   2. Cough    Plan per above. Will treat symptomatically. Patient to f/u with PCP and for results of recent MRI as planned.   Patient with reproducible back pain. No neurological deficits. Patient is ambulatory. No warning symptoms of back pain including: loss of bowel or bladder control, night sweats, waking from sleep with back pain, unexplained fevers or weight loss, h/o cancer, IVDU, recent trauma. No concern for cauda equina, epidural abscess, or other serious cause of back pain. Conservative measures such as rest, ice/heat and pain medicine indicated with PCP follow-up if no improvement with conservative management.       Renne Crigler, PA-C 02/27/13 [phone]

## 2013-02-27 NOTE — ED Notes (Signed)
Patient medicated for pain and nausea, see MAR Patient taken to CXR Patient in NAD upon leaving room

## 2013-02-27 NOTE — ED Provider Notes (Signed)
Medical screening examination/treatment/procedure(s) were performed by non-physician practitioner and as supervising physician I was immediately available for consultation/collaboration.  EKG Interpretation   None         Angelis Gates L Kimari Coudriet, MD 02/27/13 0736 

## 2013-02-27 NOTE — ED Notes (Signed)
Assumed care of patient Presenting complaint noted to be flank pain, however after speaking with patient and family member, patient with c/o back pain which they believe is r/t work injury Patient also with c/o productive cough Urinal provided to patient

## 2013-02-27 NOTE — ED Notes (Signed)
Patient back from CXR Patient in NAD Side rails up, call bell in reach

## 2013-02-27 NOTE — ED Notes (Signed)
PA at bedside.

## 2013-10-02 ENCOUNTER — Telehealth: Payer: Self-pay | Admitting: Family

## 2013-10-02 NOTE — Telephone Encounter (Signed)
Spoke with patients wife, she states she will have patient call back first of next week to schedule appointment  Diabetic bundle pt needs BP check, LDL & A1C

## 2013-10-12 ENCOUNTER — Ambulatory Visit: Payer: Self-pay | Admitting: Family

## 2013-10-14 ENCOUNTER — Other Ambulatory Visit: Payer: Self-pay | Admitting: Medical

## 2013-10-14 ENCOUNTER — Encounter: Payer: Self-pay | Admitting: Family

## 2013-10-14 ENCOUNTER — Ambulatory Visit (INDEPENDENT_AMBULATORY_CARE_PROVIDER_SITE_OTHER): Payer: No Typology Code available for payment source | Admitting: Family

## 2013-10-14 VITALS — BP 142/100 | HR 72 | Temp 98.1°F | Resp 16 | Ht 72.0 in | Wt 239.0 lb

## 2013-10-14 DIAGNOSIS — IMO0002 Reserved for concepts with insufficient information to code with codable children: Secondary | ICD-10-CM

## 2013-10-14 DIAGNOSIS — E785 Hyperlipidemia, unspecified: Secondary | ICD-10-CM

## 2013-10-14 DIAGNOSIS — I10 Essential (primary) hypertension: Secondary | ICD-10-CM

## 2013-10-14 DIAGNOSIS — E118 Type 2 diabetes mellitus with unspecified complications: Secondary | ICD-10-CM

## 2013-10-14 DIAGNOSIS — E1165 Type 2 diabetes mellitus with hyperglycemia: Secondary | ICD-10-CM

## 2013-10-14 DIAGNOSIS — M542 Cervicalgia: Secondary | ICD-10-CM | POA: Insufficient documentation

## 2013-10-14 LAB — BASIC METABOLIC PANEL WITH GFR
BUN: 14 mg/dL (ref 6–23)
CALCIUM: 9.5 mg/dL (ref 8.4–10.5)
CO2: 24 mEq/L (ref 19–32)
CREATININE: 1.13 mg/dL (ref 0.50–1.35)
Chloride: 103 mEq/L (ref 96–112)
GFR, EST AFRICAN AMERICAN: 87 mL/min
GFR, EST NON AFRICAN AMERICAN: 75 mL/min
Glucose, Bld: 268 mg/dL — ABNORMAL HIGH (ref 70–99)
Potassium: 4.2 mEq/L (ref 3.5–5.3)
Sodium: 138 mEq/L (ref 135–145)

## 2013-10-14 LAB — HEPATIC FUNCTION PANEL
ALT: 27 U/L (ref 0–53)
AST: 19 U/L (ref 0–37)
Albumin: 4.6 g/dL (ref 3.5–5.2)
Alkaline Phosphatase: 82 U/L (ref 39–117)
BILIRUBIN DIRECT: 0.1 mg/dL (ref 0.0–0.3)
Indirect Bilirubin: 0.5 mg/dL (ref 0.2–1.2)
Total Bilirubin: 0.6 mg/dL (ref 0.2–1.2)
Total Protein: 7.5 g/dL (ref 6.0–8.3)

## 2013-10-14 LAB — LIPID PANEL
Cholesterol: 207 mg/dL — ABNORMAL HIGH (ref 0–200)
HDL: 37 mg/dL — ABNORMAL LOW (ref >39)
LDL CALC: 140 mg/dL — AB (ref 0–99)
Total CHOL/HDL Ratio: 5.6 Ratio
Triglycerides: 150 mg/dL — ABNORMAL HIGH (ref <150)
VLDL: 30 mg/dL (ref 0–40)

## 2013-10-14 LAB — HEMOGLOBIN A1C
Hgb A1c MFr Bld: 8.9 % — ABNORMAL HIGH (ref <5.7)
Mean Plasma Glucose: 209 mg/dL — ABNORMAL HIGH (ref <117)

## 2013-10-14 MED ORDER — HYDROCHLOROTHIAZIDE 25 MG PO TABS: 25.0000 mg | ORAL_TABLET | Freq: Every day | ORAL | Status: DC

## 2013-10-14 MED ORDER — LISINOPRIL 10 MG PO TABS: 10.0000 mg | ORAL_TABLET | Freq: Every day | ORAL | Status: DC

## 2013-10-14 MED ORDER — METFORMIN HCL 1000 MG PO TABS: ORAL_TABLET | ORAL | Status: DC

## 2013-10-14 MED ORDER — DICLOFENAC SODIUM 75 MG PO TBEC: 75.0000 mg | DELAYED_RELEASE_TABLET | Freq: Two times a day (BID) | ORAL | Status: DC

## 2013-10-14 MED ORDER — METHOCARBAMOL 500 MG PO TABS: 1000.0000 mg | ORAL_TABLET | Freq: Four times a day (QID) | ORAL | Status: DC

## 2013-10-14 MED ORDER — LISINOPRIL 10 MG PO TABS
10.0000 mg | ORAL_TABLET | Freq: Every day | ORAL | Status: DC
Start: 2013-10-14 — End: 2014-07-07

## 2013-10-14 NOTE — Assessment & Plan Note (Signed)
Rx of diclofenac and robaxin. Consideing PT if pain persists. If pain changes mid cspine then will get cervical spine xray.

## 2013-10-14 NOTE — Assessment & Plan Note (Signed)
Refilled metformin and pt will restart.

## 2013-10-14 NOTE — Progress Notes (Signed)
Subjective:    Patient ID: Thomas Ortega, male    DOB: March 15, 1961, 53 y.o.   MRN: [id number]  HPI  Pt in for follow up on htn and diabetes.  Pt not on meds for 3 months he ran out and had no insurance. When on meds had no side effects.   Pt last bp one year ago borderline. Pt has not been checking his blood pressure. Adds no extra salt to foods. Pt does exercise twice a week 4 miles.   BP Readings from Last 3 Encounters:  10/14/13 142/100  02/27/13 138/88  10/14/12 150/92    Pt diabetic and not on recent meds. No bs checks. No hyperglcyemic signs and symptoms.  3 wks ago he has some neck pain. When he moves his neck feels some pain and tension.(No crepitus) Some pain on rt side of his neck.  Pt only tried [place] gay type product. Not helping much. States just woke up and felt the pain.  .   Review of Systems  Constitutional: Negative for fever, chills and fatigue.  HENT: Negative.   Respiratory: Negative.   Cardiovascular: Negative for chest pain and palpitations.  Gastrointestinal: Negative.   Endocrine: Negative for polydipsia, polyphagia and polyuria.  Genitourinary: Negative.   Musculoskeletal: Positive for myalgias.       Rt sided trapezius pain for 3 wks. No radiating pain to rt upper extremities. Myalgia only rt side neck.  Skin: Negative for color change, pallor, rash and wound.  Neurological: Negative.    Past Medical History  Diagnosis Date  . Diabetes mellitus   . Hypertension   . Hepatitis B   . Nausea     History   Social History  . Marital Status: Married    Spouse Name: N/A    Number of Children: 1  . Years of Education: N/A   Occupational History  . unemployed    Social History Main Topics  . Smoking status: Never Smoker   . Smokeless tobacco: Never Used  . Alcohol Use: No  . Drug Use: No  . Sexual Activity: Not on file   Other Topics Concern  . Not on file   Social History Narrative   Originally from [place]   Currently unemployed   Has 2 grown children who still live in [place]    Past Surgical History  Procedure Laterality Date  . Mass excision  2009    base of skull    Family History  Problem Relation Age of Onset  . Prostate cancer Neg Hx   . Colon cancer Neg Hx   . Breast cancer Neg Hx   . Heart disease Neg Hx   . Diabetes Neg Hx     No Known Allergies  Current Outpatient Prescriptions on File Prior to Visit  Medication Sig Dispense Refill  . aspirin 81 MG tablet Take 81 mg by mouth daily.      Marland Kitchen HYDROcodone-acetaminophen (NORCO/VICODIN) 5-325 MG per tablet Take 1-2 tablets every 6 hours as needed for severe pain  12 tablet  0  . omeprazole (PRILOSEC) 40 MG capsule Take 1 capsule (40 mg total) by mouth daily.  30 capsule  3   No current facility-administered medications on file prior to visit.    BP 142/100  Pulse 72  Temp(Src) 98.1 F (36.7 C) (Oral)  Resp 16  Ht 6' (1.829 m)  Wt 239 lb (108.41 kg)  BMI 32.41 kg/m2  SpO2 97%       Objective:  Physical Exam  Constitutional: He is oriented to person, place, and time. He appears well-developed and well-nourished. No distress.  HENT:  Head: Normocephalic.  Eyes: Conjunctivae and EOM are normal. Pupils are equal, round, and reactive to light.  Neck: Normal range of motion. Neck supple. No JVD present. No tracheal deviation present. No thyromegaly present.  No mid cervical spine tenderness. Rt trapezius mild tender to palpation.   Cardiovascular: Normal rate, regular rhythm and normal heart sounds.   Pulmonary/Chest: Breath sounds normal. No stridor. No respiratory distress. He has no wheezes. He has no rales.  Musculoskeletal: Normal range of motion.  Good grip strength upper extremities.  Lymphadenopathy:    He has no cervical adenopathy.  Neurological: He is alert and oriented to person, place, and time.  Skin: He is not diaphoretic.  Psychiatric: He has a normal mood and affect. His behavior is normal. Judgment and thought content  normal.          Assessment & Plan:  Patient seen along with Evern Core PA-C who is currently in orientation for training purposes. I have personally examined pt and agree with assessment and plan.

## 2013-10-14 NOTE — Patient Instructions (Signed)
Please restart diabetic and hypertension medications. Will send to pharmacy. Also get labs today. For your neck pain start muscle relaxant and diclofenac prescription. Please stop all otc nsaids. If pain persist in neck notify us and could refer to physical therapy. If pain changes toward middle of neck then will order xray.

## 2013-10-14 NOTE — Assessment & Plan Note (Signed)
Refilled his lisinopril and hctz today. Pt will restart.

## 2013-10-14 NOTE — Progress Notes (Signed)
Pre visit review using our clinic review tool, if applicable. No additional management support is needed unless otherwise documented below in the visit note. 

## 2013-10-15 ENCOUNTER — Telehealth: Payer: Self-pay | Admitting: Family

## 2013-10-15 MED ORDER — ATORVASTATIN CALCIUM 20 MG PO TABS: 20.0000 mg | ORAL_TABLET | Freq: Every day | ORAL | Status: DC

## 2013-10-15 NOTE — Telephone Encounter (Signed)
Please contact pt and let him know that lab work shows uncontrolled diabetes and elevated cholesterol. Restart glucophage as we discussed at his visit. I would also like him to start atorvastatin for his cholesterol. Repeat flp/lft in 6 weeks.

## 2013-10-15 NOTE — Telephone Encounter (Signed)
Relevant patient education assigned to patient using Emmi. ° °

## 2013-10-15 NOTE — Telephone Encounter (Signed)
Pt was contacted and results were given.Pt voiced understanding of this. Pt was informed of New Rx that had been sent for him.Pt was informed to also re-start Glucophage as discussed per visit w/ NP.Pt stated he would be RTC within 6 weeks as requested to repeat labs.

## 2013-11-02 ENCOUNTER — Telehealth: Payer: Self-pay

## 2013-11-02 NOTE — Telephone Encounter (Signed)
Pt last had BP, LDL and A1C checked on 10-14-13.  Too soon to recheck

## 2013-11-16 ENCOUNTER — Ambulatory Visit (INDEPENDENT_AMBULATORY_CARE_PROVIDER_SITE_OTHER): Payer: No Typology Code available for payment source | Admitting: Nurse Practitioner

## 2013-11-16 ENCOUNTER — Encounter: Payer: Self-pay | Admitting: Nurse Practitioner

## 2013-11-16 VITALS — BP 148/104 | HR 87 | Temp 98.1°F | Resp 16 | Ht 72.0 in | Wt 237.8 lb

## 2013-11-16 DIAGNOSIS — M545 Low back pain, unspecified: Secondary | ICD-10-CM

## 2013-11-16 DIAGNOSIS — M2578 Osteophyte, vertebrae: Secondary | ICD-10-CM

## 2013-11-16 DIAGNOSIS — M5380 Other specified dorsopathies, site unspecified: Secondary | ICD-10-CM

## 2013-11-16 DIAGNOSIS — M542 Cervicalgia: Secondary | ICD-10-CM

## 2013-11-16 MED ORDER — PREDNISONE 10 MG PO TABS: ORAL_TABLET | ORAL | Status: DC

## 2013-11-16 MED ORDER — METHYLPREDNISOLONE ACETATE 40 MG/ML IJ SUSP
40.0000 mg | Freq: Once | INTRAMUSCULAR | Status: AC
  Administered 2013-11-16: 40 mg via INTRAMUSCULAR

## 2013-11-16 NOTE — Patient Instructions (Signed)
Start prednisone tomorrow morning.  Daily neck, shoulder & back exercises. Do not force painful movement. Do what you can.   Please see Spine specialist.  Do these exercises:  Visit you tube for these exercise regimens: "physio Neck Exercises: Stretch & Relieve routine" by Mauri Brooklyn; "Pilates Seated Stretch" by [employer]; "Physio med Neck & Upper body stretch Exercises-Occupational Physiotherapy". Use muscle relaxer at night. Get up every hour while at work & do neck & shoulder exercises-take 5-7 minutes.    An effective stretch should be held for at least 30 seconds.  A stretch should never be painful. You should only feel a gentle lengthening or release in the stretched tissue. FLEXION RANGE OF MOTION AND STRETCHING EXERCISES: STRETCH - Flexion, Single Knee to Chest  Lie on a firm bed or the floor with both legs extended in front of you.  Keeping one leg in contact with the floor, bring your opposite knee to your chest. Hold your leg in place by either grabbing behind your thigh or at your knee.  Pull until you feel a gentle stretch in your low back. Hold __________ seconds.  Slowly release your grasp and repeat the exercise with the opposite side. Repeat __________ times. Complete this exercise __________ times per day.  STRETCH - Flexion, Double Knee to Chest  Lie on a firm bed or the floor with both legs extended in front of you.  Keeping one leg in contact with the floor, bring your opposite knee to your chest.  Tense your stomach muscles to support your back and then lift your other knee to your chest. Hold your legs in place by either grabbing behind your thighs or at your knees.  Pull both knees toward your chest until you feel a gentle stretch in your low back. Hold __________ seconds.  Tense your stomach muscles and slowly return one leg at a time to the floor. Repeat __________ times. Complete this exercise __________ times per day.  STRENGTHENING  EXERCISES - Spondylolysis These exercises may help you when beginning to rehabilitate your injury. These exercises should be done near your "sweet spot." This is the neutral, low-back arch, somewhere between fully rounded and fully arched, that is your least painful position. When performed in this safe range of motion, these exercises can be used for people who have either a flexion or extension based injury. These exercises may resolve your symptoms with or without further involvement from your physician, physical therapist or athletic trainer. While completing these exercises, remember:   Muscles can gain both the endurance and the strength needed for everyday activities through controlled exercises.  Complete these exercises as instructed by your physician, physical therapist or athletic trainer. Progress with the resistance and repetition exercises only as your caregiver advises.  You may experience muscle soreness or fatigue, but the pain or discomfort you are trying to eliminate should never worsen during these exercises. If this pain does worsen, stop and make certain you are following the directions exactly. If the pain is still present after adjustments, discontinue the exercise until you can discuss the trouble with your clinician. STRENGTHENING - Deep Abdominals, Pelvic Tilt   Lie on a firm bed or the floor. Keeping your legs in front of you, bend your knees so they are both pointed toward the ceiling and your feet are flat on the floor.  Tense your lower abdominal muscles to press your low back into the floor. This motion will rotate your pelvis so that your tail bone  is scooping upwards rather than pointing at your feet or into the floor.  With a gentle tension and even breathing, hold this position for __________ seconds. Repeat __________ times. Complete this exercise __________ times per day.  STRENGTHENING - Quadruped, Opposite UE/LE Lift  Assume a hands and knees position on a  firm surface. Keep your hands under your shoulders and your knees under your hips. You may place padding under your knees for comfort.  Find your neutral spine and gently tense your abdominal muscles so that you can maintain this position. Your shoulders and hips should form a rectangle that is parallel with the floor and is not twisted.  Keeping your trunk steady, lift your right hand no higher than your shoulder and then your left leg no higher than your hip. Make sure you are not holding your breath. Hold this position __________ seconds.  Continuing to keep your abdominal muscles tense and your back steady, slowly return to your starting position. Repeat with the opposite arm and leg. Repeat __________ times. Complete this exercise __________ times per day.  STRENGTHENING - Lower Abdominals, Double Knee Lift  Lie on a firm bed or the floor. Keeping your legs in front of you, bend your knees so they are both pointed toward the ceiling and your feet are flat on the floor.  Tense your abdominal muscles to brace your low back and slowly lift both of your knees until they come over your hips. Be certain not to hold your breath.  Hold __________ seconds. Using your abdominal muscles, return to the starting position in a slow and controlled manner. Repeat __________ times. Complete this exercise __________ times per day.  POSTURE AND BODY MECHANICS CONSIDERATIONS - Spondylolysis Keeping correct posture when sitting, standing or completing your activities will reduce the stress put on different body tissues, allowing injured tissues a chance to heal and limiting painful experiences. The following are general guidelines for improved posture. Your physician or physical therapist will provide you with any instructions specific to your needs. While reading these guidelines, remember:  The exercises prescribed by your provider will help you have the flexibility and strength to maintain correct  postures.  The correct posture provides the optimal environment for your joints to work. All of your joints have less wear and tear when properly supported by a spine with good posture. This means you will experience a healthier, less painful body.  Correct posture must be practiced with all of your activities, especially prolonged sitting and standing. Correct posture is as important when doing repetitive low-stress activities (typing) as it is when doing a single heavy-load activity (lifting). PROPER SITTING POSTURE In order to minimize stress and discomfort on your spine, you must sit with correct posture. Sitting with good posture should be effortless for a healthy body. Returning to good posture is a gradual process. Many people can work toward this most comfortably by using various supports until they have the flexibility and strength to maintain this posture on their own. When sitting with proper posture, your ears will fall over your shoulders and your shoulders will fall over your hips. You should use the back of the chair to support your upper back. Your low back will be in a neutral position, just slightly arched. You may place a small pillow or folded towel at the base of your low back for support.  When working at a desk, create an environment that supports good, upright posture. Without extra support, muscles fatigue and lead to  excessive strain on joints and other tissues. Keep these recommendations in mind: CHAIR:  A chair should be able to slide under your desk when your back makes contact with the back of the chair. This allows you to work closely.  The chair's height should allow your eyes to be level with the upper part of your monitor and your hands to be slightly lower than your elbows. BODY POSITION  Your feet should make contact with the floor. If this is not possible, use a foot rest.  Keep your ears over your shoulders. This will reduce stress on your neck and low  back. INCORRECT SITTING POSTURES If you are feeling tired and unable to assume a healthy sitting posture, do not slouch or slump. This puts excessive strain on your back tissues, causing more damage and pain. Healthier options include:  Using more support, like a lumbar pillow.  Switching tasks to something that requires you to be upright or walking.  Talking a brief walk.  Lying down to rest in a neutral-spine position. CORRECT LIFTING TECHNIQUES DO :   Assume a wide stance. This will provide you more stability and the opportunity to get as close as possible to the object which you are lifting.  Tense your abdominals to brace your spine; then bend at the knees and hips. Keeping your back locked in a neutral-spine position, lift using your leg muscles. Lift with your legs, keeping your back straight.  Test the weight of unknown objects before attempting to lift them.  Try to keep your elbows locked down at your sides in order get the best strength from your shoulders when carrying an object.  Always ask for help when lifting heavy or awkward objects. INCORRECT LIFTING TECHNIQUES DO NOT:   Lock your knees when lifting, even if it is a small object.  Bend and twist. Pivot at your feet or move your feet when needing to change directions.  Assume that you cannot safely pick up a paperclip without proper posture. Document Released: 03/12/2005 Document Revised: 07/27/2013 Document Reviewed: 06/24/2008 [facility] Patient Information 2015 [place], [place]. This information is not intended to replace advice given to you by your health care provider. Make sure you discuss any questions you have with your health care provider.

## 2013-11-16 NOTE — Progress Notes (Signed)
Pre visit review using our clinic review tool, if applicable. No additional management support is needed unless otherwise documented below in the visit note/SLS  

## 2013-11-17 NOTE — Progress Notes (Signed)
   Subjective:    Patient ID: Thomas Ortega, male    DOB: 17-Oct-1960, 53 y.o.   MRN: [id number]  Neck Pain  This is a chronic problem. The current episode started more than 1 year ago (pt has had neck pain for at least 5 years. 2010 MRI showed bilat osteophyte formation R>L, & spondylosis). The problem occurs constantly. The problem has been rapidly worsening (pt has been unable to turn head to right without severe pain for several days.). The pain is associated with nothing. The pain is present in the left side and right side. The quality of the pain is described as burning, aching and stabbing. The pain is severe. The symptoms are aggravated by twisting (rotation of head, forward flexion neck ). The pain is same all the time. Associated symptoms include headaches. Pertinent negatives include no numbness, paresis, tingling or weakness. He has tried muscle relaxants for the symptoms. The treatment provided no relief.      Review of Systems  Musculoskeletal: Positive for back pain and neck pain.  Skin: Negative for rash.  Neurological: Positive for headaches. Negative for dizziness, tingling, tremors, weakness and numbness.       Objective:   Physical Exam  Vitals reviewed. Constitutional: He is oriented to person, place, and time. He appears well-developed and well-nourished. No distress.  HENT:  Head: Normocephalic and atraumatic.  Eyes: Conjunctivae are normal. Right eye exhibits no discharge. Left eye exhibits no discharge.  Cardiovascular: Normal rate.   Pulmonary/Chest: Effort normal. No respiratory distress.  Musculoskeletal: He exhibits tenderness. He exhibits no edema.  Tenderness over c-spine & SI joints. Decreased forward flexion neck, not able to rotate head to right without severe pain. Minimal rotation to left without pain. Full ROM in shoulders. Limited lateral & forward flexion of spine-not able to reach knees due to pain. Loss of lordosis.  Neurological: He is alert and  oriented to person, place, and time.  Skin: Skin is warm and dry.  Psychiatric: He has a normal mood and affect. His behavior is normal. Thought content normal.          Assessment & Plan:  1. Neck pain - methylPREDNISolone acetate (DEPO-MEDROL) injection 40 mg; Inject 1 mL (40 mg total) into the muscle once. - predniSONE (DELTASONE) 10 MG tablet; Starting tomorrow, Take 4Tpo qam X 1d, then 3T po qam X 3d, then 2T po qd X 3d, then 1T po qam X 3d.  Dispense: 22 tablet; Refill: 0 - Ambulatory referral to Spine Surgery  2. Lumbar pain on palpation - predniSONE (DELTASONE) 10 MG tablet; Starting tomorrow, Take 4Tpo qam X 1d, then 3T po qam X 3d, then 2T po qd X 3d, then 1T po qam X 3d.  Dispense: 22 tablet; Refill: 0 - Ambulatory referral to Spine Surgery  3. Osteophyte of cervical spine Consider ankylosing spondylitis.  - Ambulatory referral to Spine Surgery

## 2014-05-12 ENCOUNTER — Other Ambulatory Visit: Payer: Self-pay | Admitting: Medical

## 2014-07-07 ENCOUNTER — Ambulatory Visit (INDEPENDENT_AMBULATORY_CARE_PROVIDER_SITE_OTHER): Payer: BLUE CROSS/BLUE SHIELD | Admitting: Family

## 2014-07-07 ENCOUNTER — Encounter: Payer: Self-pay | Admitting: Family

## 2014-07-07 VITALS — BP 120/84 | HR 76 | Temp 97.5°F | Resp 16 | Ht 72.0 in | Wt 235.2 lb

## 2014-07-07 DIAGNOSIS — IMO0002 Reserved for concepts with insufficient information to code with codable children: Secondary | ICD-10-CM

## 2014-07-07 DIAGNOSIS — B351 Tinea unguium: Secondary | ICD-10-CM

## 2014-07-07 DIAGNOSIS — E1165 Type 2 diabetes mellitus with hyperglycemia: Secondary | ICD-10-CM | POA: Diagnosis not present

## 2014-07-07 DIAGNOSIS — E119 Type 2 diabetes mellitus without complications: Secondary | ICD-10-CM

## 2014-07-07 DIAGNOSIS — E785 Hyperlipidemia, unspecified: Secondary | ICD-10-CM

## 2014-07-07 DIAGNOSIS — R002 Palpitations: Secondary | ICD-10-CM | POA: Diagnosis not present

## 2014-07-07 DIAGNOSIS — I10 Essential (primary) hypertension: Secondary | ICD-10-CM | POA: Diagnosis not present

## 2014-07-07 DIAGNOSIS — K219 Gastro-esophageal reflux disease without esophagitis: Secondary | ICD-10-CM

## 2014-07-07 LAB — BASIC METABOLIC PANEL
BUN: 19 mg/dL (ref 6–23)
CALCIUM: 10.2 mg/dL (ref 8.4–10.5)
CO2: 33 meq/L — AB (ref 19–32)
CREATININE: 1.27 mg/dL (ref 0.40–1.50)
Chloride: 102 mEq/L (ref 96–112)
GFR: 76.43 mL/min (ref >60.00)
Glucose, Bld: 123 mg/dL — ABNORMAL HIGH (ref 70–99)
Potassium: 4 mEq/L (ref 3.5–5.1)
Sodium: 140 mEq/L (ref 135–145)

## 2014-07-07 LAB — CBC WITH DIFFERENTIAL/PLATELET
BASOS PCT: 0.3 % (ref 0.0–3.0)
Basophils Absolute: 0 10*3/uL (ref 0.0–0.1)
EOS PCT: 4.2 % (ref 0.0–5.0)
Eosinophils Absolute: 0.2 10*3/uL (ref 0.0–0.7)
HCT: 45.5 % (ref 39.0–52.0)
Hemoglobin: 15.7 g/dL (ref 13.0–17.0)
Lymphs Abs: 2.9 10*3/uL (ref 0.7–4.0)
MCHC: 34.5 g/dL (ref 30.0–36.0)
MCV: 81.5 fl (ref 78.0–100.0)
Monocytes Absolute: 0.3 10*3/uL (ref 0.1–1.0)
Monocytes Relative: 6.2 % (ref 3.0–12.0)
Neutro Abs: 1.5 10*3/uL (ref 1.4–7.7)
Neutrophils Relative %: 30.5 % — ABNORMAL LOW (ref 43.0–77.0)
Platelets: 146 10*3/uL — ABNORMAL LOW (ref 150.0–400.0)
RBC: 5.59 Mil/uL (ref 4.22–5.81)
RDW: 14.5 % (ref 11.5–15.5)
WBC: 5 10*3/uL (ref 4.0–10.5)

## 2014-07-07 LAB — HEPATIC FUNCTION PANEL
ALK PHOS: 61 U/L (ref 39–117)
ALT: 33 U/L (ref 0–53)
AST: 20 U/L (ref 0–37)
Albumin: 4.6 g/dL (ref 3.5–5.2)
BILIRUBIN TOTAL: 0.5 mg/dL (ref 0.2–1.2)
Bilirubin, Direct: 0.1 mg/dL (ref 0.0–0.3)
Total Protein: 8 g/dL (ref 6.0–8.3)

## 2014-07-07 LAB — LIPID PANEL
Cholesterol: 190 mg/dL (ref 0–200)
HDL: 36.8 mg/dL — ABNORMAL LOW (ref >39.00)
LDL Cholesterol: 131 mg/dL — ABNORMAL HIGH (ref 0–99)
NONHDL: 153.2
Total CHOL/HDL Ratio: 5
Triglycerides: 110 mg/dL (ref 0.0–149.0)
VLDL: 22 mg/dL (ref 0.0–40.0)

## 2014-07-07 LAB — MICROALBUMIN / CREATININE URINE RATIO
CREATININE, U: 390.6 mg/dL
MICROALB UR: 0.9 mg/dL (ref 0.0–1.9)
MICROALB/CREAT RATIO: 0.2 mg/g (ref 0.0–30.0)

## 2014-07-07 LAB — HEMOGLOBIN A1C: HEMOGLOBIN A1C: 6.1 % (ref 4.6–6.5)

## 2014-07-07 LAB — TSH: TSH: 1.38 u[IU]/mL (ref 0.35–4.50)

## 2014-07-07 MED ORDER — HYDROCHLOROTHIAZIDE 25 MG PO TABS: 25.0000 mg | ORAL_TABLET | Freq: Every day | ORAL | Status: DC

## 2014-07-07 MED ORDER — OMEPRAZOLE 40 MG PO CPDR: 40.0000 mg | DELAYED_RELEASE_CAPSULE | Freq: Every day | ORAL | Status: DC

## 2014-07-07 MED ORDER — ATORVASTATIN CALCIUM 20 MG PO TABS: 20.0000 mg | ORAL_TABLET | Freq: Every day | ORAL | Status: DC

## 2014-07-07 MED ORDER — METFORMIN HCL 1000 MG PO TABS: ORAL_TABLET | ORAL | Status: DC

## 2014-07-07 MED ORDER — LISINOPRIL 10 MG PO TABS: 10.0000 mg | ORAL_TABLET | Freq: Every day | ORAL | Status: DC

## 2014-07-07 NOTE — Assessment & Plan Note (Addendum)
Obtain tsh, bmet, cbc. EKG performed and reviewed- notes NSR.  If symptoms worsen or persist, consider holter.

## 2014-07-07 NOTE — Assessment & Plan Note (Signed)
Resume statin, obtain flp.

## 2014-07-07 NOTE — Patient Instructions (Addendum)
Please complete your lab work prior to leaving. Restart your medication. Work on diabetic diet.   Follow up in 3 months.

## 2014-07-07 NOTE — Assessment & Plan Note (Signed)
Obtain follow up lft, if OK will initiate lamisil.

## 2014-07-07 NOTE — Assessment & Plan Note (Signed)
Uncontolled, resume metformin, may need second agent await a1c

## 2014-07-07 NOTE — Progress Notes (Signed)
Subjective:    Patient ID: Thomas Ortega, male    DOB: [birth year], 54 y.o.   MRN: [id number]  HPI  Patient presents today for follow up of multiple medical problems.  Diabetes Type 2  Pt is currently maintained on the following medications for diabetes:metformin  Lab Results  Component Value Date   HGBA1C 8.9* 10/14/2013   HGBA1C 6.0* 09/16/2012   HGBA1C 9.2* 07/17/2011    Lab Results  Component Value Date   MICROALBUR 0.54 09/16/2012   LDLCALC 140* 10/14/2013   CREATININE 1.13 10/14/2013    Last diabetic eye exam was: 3 months ago  Denies hypoglycemia Home glucose readings range fasting this am 154- not checking regularly Not exercising due to back pain  Hyperlipidemia  Patient is currently maintained on the following medication for hyperlipidemia: atorvastatin (ran out 5 days ago) Last lipid panel as follows:  Lab Results  Component Value Date   CHOL 207* 10/14/2013   HDL 37* 10/14/2013   LDLCALC 140* 10/14/2013   TRIG 150* 10/14/2013   CHOLHDL 5.6 10/14/2013   Patient denies myalgia. Patient reports good compliance with low fat/low cholesterol diet.   Hypertension  Patient is currently maintained on the following medications for blood pressure: hctz, lisinopril (ran out 5 days ago) Patient reports good compliance with blood pressure medications. Patient denies chest pain, shortness of breath or swelling. Last 3 blood pressure readings in our office are as follows: BP Readings from Last 3 Encounters:  07/07/14 120/84  11/16/13 148/104  10/14/13 142/100   GERD- on omeprazole. Reports gerd has been stable  Palpitations- intermittent- only notices only at rest.  Denies chest pain.  Review of Systems    see HPI  Past Medical History  Diagnosis Date  . Diabetes mellitus   . Hypertension   . Hepatitis B   . Nausea     History   Social History  . Marital Status: Married    Spouse Name: N/A  . Number of Children: 1  . Years of Education: N/A     Occupational History  . unemployed    Social History Main Topics  . Smoking status: Never Smoker   . Smokeless tobacco: Never Used  . Alcohol Use: No  . Drug Use: No  . Sexual Activity: Not on file   Other Topics Concern  . Not on file   Social History Narrative   Originally from [place]   Currently unemployed   Has 2 grown children who still live in [place]    Past Surgical History  Procedure Laterality Date  . Mass excision  2009    base of skull    Family History  Problem Relation Age of Onset  . Prostate cancer Neg Hx   . Colon cancer Neg Hx   . Breast cancer Neg Hx   . Heart disease Neg Hx   . Diabetes Neg Hx     No Known Allergies  Current Outpatient Prescriptions on File Prior to Visit  Medication Sig Dispense Refill  . aspirin 81 MG tablet Take 81 mg by mouth daily.    . diclofenac (VOLTAREN) 75 MG EC tablet Take 1 tablet (75 mg total) by mouth 2 (two) times daily. (Patient not taking: Reported on 07/07/2014) 30 tablet 0   No current facility-administered medications on file prior to visit.    BP 120/84 mmHg  Pulse 76  Temp(Src) 97.5 F (36.4 C) (Oral)  Resp 16  Ht 6' (1.829 m)  Wt 235 lb 3.2 oz (  106.686 kg)  BMI 31.89 kg/m2  SpO2 97%    Objective:   Physical Exam  Constitutional: He is oriented to person, place, and time. He appears well-developed and well-nourished. No distress.  HENT:  Head: Normocephalic and atraumatic.  Cardiovascular: Normal rate and regular rhythm.   No murmur heard. Pulmonary/Chest: Effort normal and breath sounds normal. No respiratory distress. He has no wheezes. He has no rales.  Musculoskeletal: He exhibits no edema.  Neurological: He is alert and oriented to person, place, and time.  Skin: Skin is warm and dry.  Hypertrophic toenails  Psychiatric: He has a normal mood and affect. His behavior is normal. Thought content normal.          Assessment & Plan:

## 2014-07-07 NOTE — Assessment & Plan Note (Signed)
BP is stable- suspect bp will rise if he remains off of meds, resume meds.

## 2014-07-07 NOTE — Assessment & Plan Note (Signed)
Stable on ppi, continue same.

## 2014-07-08 ENCOUNTER — Encounter: Payer: Self-pay | Admitting: Family

## 2014-07-08 ENCOUNTER — Telehealth: Payer: Self-pay | Admitting: Family

## 2014-07-08 NOTE — Telephone Encounter (Signed)
Sugar control is much better.  Cholesterol is improving but still above goal. Resume meds as we discussed at his visit.

## 2014-07-09 NOTE — Telephone Encounter (Signed)
Left detailed message on home# and to call if any questions. 

## 2014-07-25 IMAGING — CR DG CHEST 2V
2 series · 2 of 2 positions shown · non-contrast
Comparison: None.

CLINICAL DATA: Cough

EXAM:
CHEST  2 VIEW

[w chest pa]
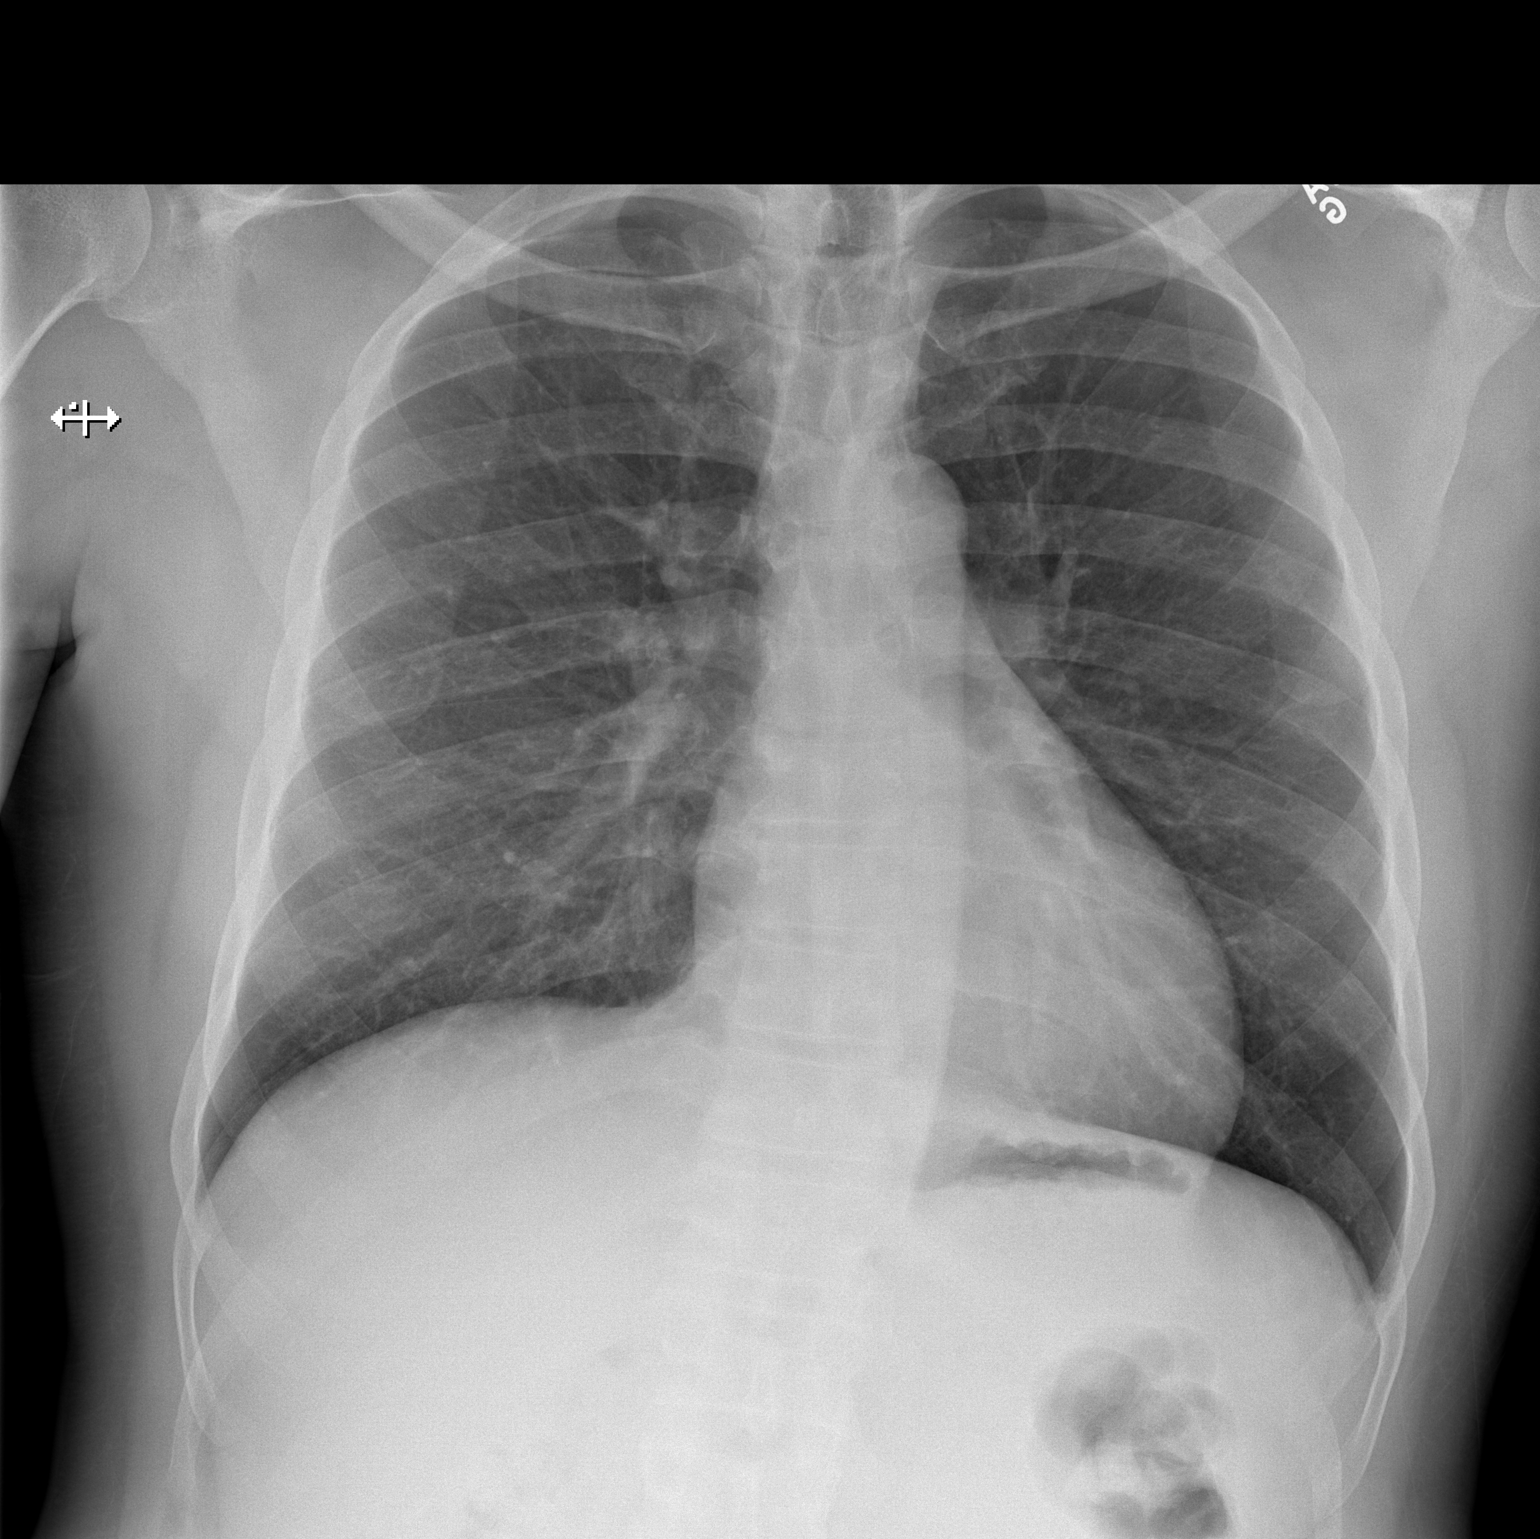

[w chest lat]
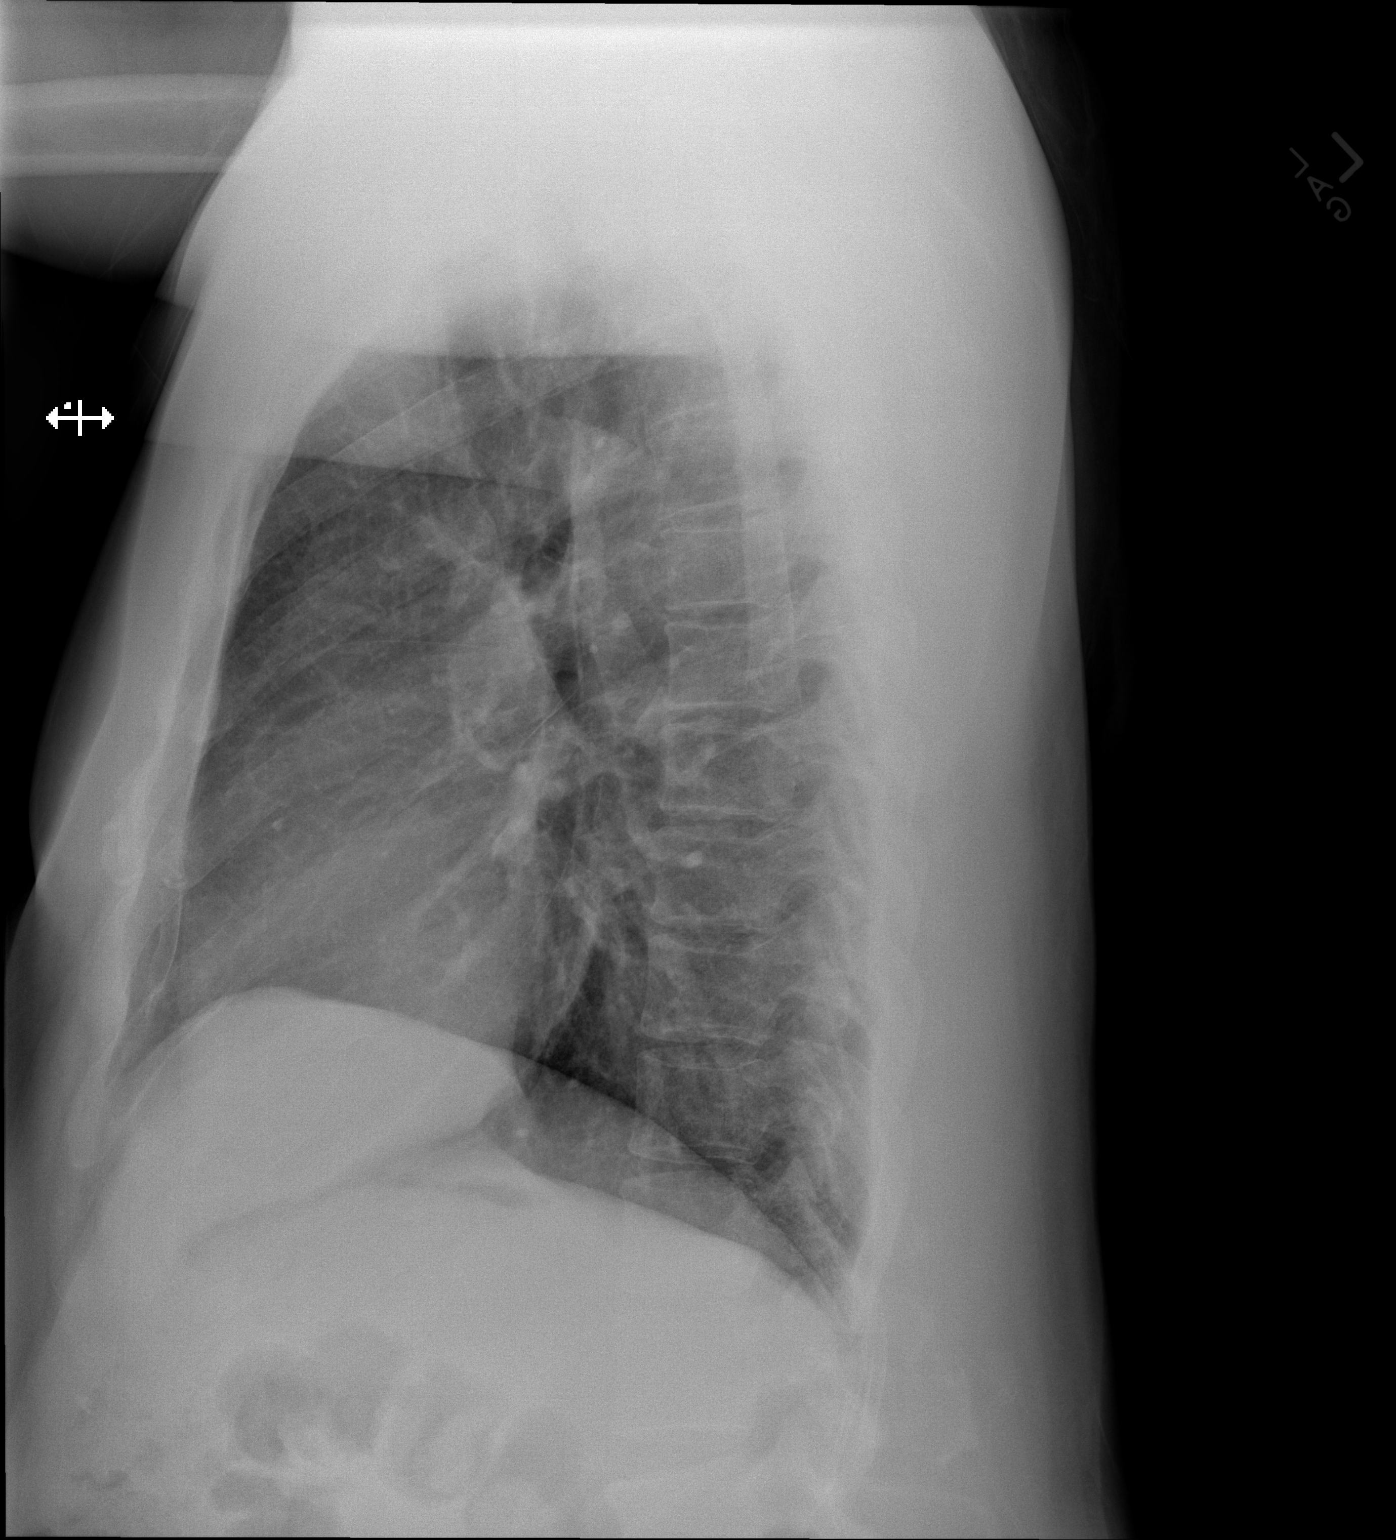

[2 of 2 positions shown; findings below may reference images not displayed]

FINDINGS: The heart size and mediastinal contours are within normal limits.
Both lungs are clear. The visualized skeletal structures are
unremarkable.
IMPRESSION: No active cardiopulmonary disease.

## 2014-09-01 ENCOUNTER — Telehealth: Payer: Self-pay | Admitting: *Deleted

## 2014-09-01 DIAGNOSIS — I10 Essential (primary) hypertension: Secondary | ICD-10-CM

## 2014-09-01 MED ORDER — HYDROCHLOROTHIAZIDE 25 MG PO TABS: 25.0000 mg | ORAL_TABLET | Freq: Every day | ORAL | Status: DC

## 2014-09-01 MED ORDER — ATORVASTATIN CALCIUM 20 MG PO TABS: 20.0000 mg | ORAL_TABLET | Freq: Every day | ORAL | Status: DC

## 2014-09-01 MED ORDER — METFORMIN HCL 1000 MG PO TABS: ORAL_TABLET | ORAL | Status: DC

## 2014-09-01 MED ORDER — OMEPRAZOLE 40 MG PO CPDR: 40.0000 mg | DELAYED_RELEASE_CAPSULE | Freq: Every day | ORAL | Status: DC

## 2014-09-01 MED ORDER — LISINOPRIL 10 MG PO TABS: 10.0000 mg | ORAL_TABLET | Freq: Every day | ORAL | Status: DC

## 2014-09-01 NOTE — Telephone Encounter (Signed)
REceived fax from [facility] requesting 90 day supply of the following:  Atovastatsn, HCTZ, Lisinopril, metformin and omeprazole.  Refills sent to pharmacy.

## 2014-09-06 ENCOUNTER — Ambulatory Visit (INDEPENDENT_AMBULATORY_CARE_PROVIDER_SITE_OTHER): Payer: BLUE CROSS/BLUE SHIELD | Admitting: Physician Assistant

## 2014-09-06 ENCOUNTER — Encounter: Payer: Self-pay | Admitting: Physician Assistant

## 2014-09-06 ENCOUNTER — Other Ambulatory Visit (HOSPITAL_BASED_OUTPATIENT_CLINIC_OR_DEPARTMENT_OTHER): Payer: BLUE CROSS/BLUE SHIELD

## 2014-09-06 VITALS — BP 144/82 | HR 72 | Temp 98.1°F | Resp 16 | Ht 66.0 in | Wt 235.4 lb

## 2014-09-06 DIAGNOSIS — N50812 Left testicular pain: Secondary | ICD-10-CM | POA: Insufficient documentation

## 2014-09-06 DIAGNOSIS — N508 Other specified disorders of male genital organs: Secondary | ICD-10-CM | POA: Diagnosis not present

## 2014-09-06 DIAGNOSIS — Q53111 Unilateral intraabdominal testis: Secondary | ICD-10-CM

## 2014-09-06 DIAGNOSIS — Q5311 Abdominal testis, unilateral: Secondary | ICD-10-CM | POA: Diagnosis not present

## 2014-09-06 DIAGNOSIS — M5441 Lumbago with sciatica, right side: Secondary | ICD-10-CM | POA: Insufficient documentation

## 2014-09-06 MED ORDER — METHYLPREDNISOLONE 4 MG PO TBPK: ORAL_TABLET | ORAL | Status: DC

## 2014-09-06 MED ORDER — HYDROCODONE-ACETAMINOPHEN 10-325 MG PO TABS: 1.0000 | ORAL_TABLET | Freq: Three times a day (TID) | ORAL | Status: DC | PRN

## 2014-09-06 NOTE — Progress Notes (Signed)
Patient presents to clinic today c/o right sided low back pain with radiation into lower extremity x 3 days.  Denies trauma or injury. Denies saddle anesthesia or change to bowel or bladder habits.  Endorses prior history of herniated disc in lower spine with last imaging some years ago.  MRI are not available for review.  Patient endorses pain in L scrotal region over the past few months with intermittent swelling.  Also endorses R testicular pain but without swelling.  Denies trauma or injury to area.  Denies discharge from penis.  Is monogamous with wife but is not currently sexually active.  Past Medical History  Diagnosis Date  . Diabetes mellitus   . Hypertension   . Hepatitis B   . Nausea     Current Outpatient Prescriptions on File Prior to Visit  Medication Sig Dispense Refill  . atorvastatin (LIPITOR) 20 MG tablet Take 1 tablet (20 mg total) by mouth daily. 90 tablet 1  . hydrochlorothiazide (HYDRODIURIL) 25 MG tablet Take 1 tablet (25 mg total) by mouth daily. 90 tablet 1  . lisinopril (PRINIVIL,ZESTRIL) 10 MG tablet Take 1 tablet (10 mg total) by mouth daily. 90 tablet 1  . metFORMIN (GLUCOPHAGE) 1000 MG tablet TAKE ONE TABLET BY MOUTH TWICE DAILY WITH MEALS 180 tablet 1  . omeprazole (PRILOSEC) 40 MG capsule Take 1 capsule (40 mg total) by mouth daily. 90 capsule 1  . diclofenac (VOLTAREN) 75 MG EC tablet Take 1 tablet (75 mg total) by mouth 2 (two) times daily. (Patient not taking: Reported on 09/06/2014) 30 tablet 0   No current facility-administered medications on file prior to visit.    No Known Allergies  Family History  Problem Relation Age of Onset  . Prostate cancer Neg Hx   . Colon cancer Neg Hx   . Breast cancer Neg Hx   . Heart disease Neg Hx   . Diabetes Neg Hx     History   Social History  . Marital Status: Married    Spouse Name: N/A  . Number of Children: 1  . Years of Education: N/A   Occupational History  . unemployed    Social History  Main Topics  . Smoking status: Never Smoker   . Smokeless tobacco: Never Used  . Alcohol Use: No  . Drug Use: No  . Sexual Activity: Not on file   Other Topics Concern  . None   Social History Narrative   Originally from [place]   Currently unemployed   Has 2 grown children who still live in [place] - See HPI.  All other ROS are negative.  BP 144/82 mmHg  Pulse 72  Temp(Src) 98.1 F (36.7 C) (Oral)  Resp 16  Ht 5\' 6"  (1.676 m)  Wt 235 lb 6 oz (106.765 kg)  BMI 38.01 kg/m2  SpO2 100%  Physical Exam  Constitutional: He is oriented to person, place, and time and well-developed, well-nourished, and in no distress.  HENT:  Head: Normocephalic.  Eyes: Conjunctivae are normal.  Cardiovascular: Normal rate, regular rhythm, normal heart sounds and intact distal pulses.   Pulmonary/Chest: Effort normal and breath sounds normal. No respiratory distress. He has no wheezes. He has no rales. He exhibits no tenderness.  Genitourinary: He exhibits testicular tenderness. He exhibits no abnormal testicular mass, no abnormal scrotal mass and no scrotal tenderness.  R testicle absent.  L testicle tender without mass. Distention of spermatic cord palpable on exam. Concern for non  palpable hernia of R inguinal region.  Musculoskeletal:       Lumbar back: He exhibits pain. He exhibits normal range of motion, no tenderness, no bony tenderness and no spasm.  Neurological: He is alert and oriented to person, place, and time.  Skin: Skin is warm and dry.  Psychiatric: Affect normal.  Vitals reviewed.   Recent Results (from the past 2160 hour(s))  Hemoglobin A1c     Status: None   Collection Time: 07/07/14 10:11 AM  Result Value Ref Range   Hgb A1c MFr Bld 6.1 4.6 - 6.5 %    Comment: Glycemic Control Guidelines for People with Diabetes:Non Diabetic:  <6%Goal of Therapy: <7%Additional Action Suggested:  >8%   Microalbumin / creatinine urine ratio     Status: None   Collection  Time: 07/07/14 10:11 AM  Result Value Ref Range   Microalb, Ur 0.9 0.0 - 1.9 mg/dL   Creatinine,U 390.6 mg/dL   Microalb Creat Ratio 0.2 0.0 - 30.0 mg/g  Lipid panel     Status: Abnormal   Collection Time: 07/07/14 10:11 AM  Result Value Ref Range   Cholesterol 190 0 - 200 mg/dL    Comment: ATP III Classification       Desirable:  < 200 mg/dL               Borderline High:  200 - 239 mg/dL          High:  > = 240 mg/dL   Triglycerides 110.0 0.0 - 149.0 mg/dL    Comment: Normal:  <150 mg/dLBorderline High:  150 - 199 mg/dL   HDL 36.80 (L) >39.00 mg/dL   VLDL 22.0 0.0 - 40.0 mg/dL   LDL Cholesterol 131 (H) 0 - 99 mg/dL   Total CHOL/HDL Ratio 5     Comment:                Men          Women1/2 Average Risk     3.4          3.3Average Risk          5.0          4.42X Average Risk          9.6          7.13X Average Risk          15.0          11.0                       NonHDL 153.20     Comment: NOTE:  Non-HDL goal should be 30 mg/dL higher than patient's LDL goal (i.e. LDL goal of < 70 mg/dL, would have non-HDL goal of < 100 mg/dL)  Basic metabolic panel     Status: Abnormal   Collection Time: 07/07/14 10:11 AM  Result Value Ref Range   Sodium 140 135 - 145 mEq/L   Potassium 4.0 3.5 - 5.1 mEq/L   Chloride 102 96 - 112 mEq/L   CO2 33 (H) 19 - 32 mEq/L   Glucose, Bld 123 (H) 70 - 99 mg/dL   BUN 19 6 - 23 mg/dL   Creatinine, Ser 1.27 0.40 - 1.50 mg/dL   Calcium 10.2 8.4 - 10.5 mg/dL   GFR 76.43 >60.00 mL/min  Hepatic function panel     Status: None   Collection Time: 07/07/14 10:11 AM  Result Value Ref Range   Total Bilirubin 0.5  0.2 - 1.2 mg/dL   Bilirubin, Direct 0.1 0.0 - 0.3 mg/dL   Alkaline Phosphatase 61 39 - 117 U/L   AST 20 0 - 37 U/L   ALT 33 0 - 53 U/L   Total Protein 8.0 6.0 - 8.3 g/dL   Albumin 4.6 3.5 - 5.2 g/dL  CBC w/Diff     Status: Abnormal   Collection Time: 07/07/14 10:11 AM  Result Value Ref Range   WBC 5.0 4.0 - 10.5 K/uL   RBC 5.59 4.22 - 5.81 Mil/uL    Hemoglobin 15.7 13.0 - 17.0 g/dL   HCT 45.5 39.0 - 52.0 %   MCV 81.5 78.0 - 100.0 fl   MCHC 34.5 30.0 - 36.0 g/dL   RDW 14.5 11.5 - 15.5 %   Platelets 146.0 (L) 150.0 - 400.0 K/uL   Neutrophils Relative % 30.5 (L) 43.0 - 77.0 %   Lymphocytes Relative 58.8 aH (H) 12.0 - 46.0 %   Monocytes Relative 6.2 3.0 - 12.0 %   Eosinophils Relative 4.2 0.0 - 5.0 %   Basophils Relative 0.3 0.0 - 3.0 %   Neutro Abs 1.5 1.4 - 7.7 K/uL   Lymphs Abs 2.9 0.7 - 4.0 K/uL   Monocytes Absolute 0.3 0.1 - 1.0 K/uL   Eosinophils Absolute 0.2 0.0 - 0.7 K/uL   Basophils Absolute 0.0 0.0 - 0.1 K/uL  TSH     Status: None   Collection Time: 07/07/14 10:11 AM  Result Value Ref Range   TSH 1.38 0.35 - 4.50 uIU/mL    Assessment/Plan: Testicular pain, left Chronic. Concern for varicocele.  Will order scrotal US and Doppler arterial venous flow of pelvis. Norco given for pain relief. 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 Cannot palpate on exam. Again imaging will assess.  Will likely need referral to Urology.  Right-sided low back pain with right-sided sciatica Rx Medrol dose pack. Rx norco.  Supportive measures reviewed.  Handout given.  Follow-up for repeat imaging if symptoms not improving.

## 2014-09-06 NOTE — Assessment & Plan Note (Signed)
Rx Medrol dose pack. Rx norco.  Supportive measures reviewed.  Handout given.  Follow-up for repeat imaging if symptoms not improving.

## 2014-09-06 NOTE — Assessment & Plan Note (Signed)
Chronic. Concern for varicocele.  Will order scrotal US and Doppler arterial venous flow of pelvis. Norco given for pain relief. 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 Cannot palpate on exam. Again imaging will assess.  Will likely need referral to Urology.

## 2014-09-06 NOTE — Progress Notes (Signed)
Pre visit review using our clinic review tool, if applicable. No additional management support is needed unless otherwise documented below in the visit note/SLS  

## 2014-09-06 NOTE — Patient Instructions (Signed)
Please take steroid pack as directed for back. Use pain medication as directed if needed. Avoid heavy lifting.  Go downstairs at 5 for your ultrasound. I will call you with your results. We will treat based on findings.

## 2014-09-08 ENCOUNTER — Ambulatory Visit (HOSPITAL_BASED_OUTPATIENT_CLINIC_OR_DEPARTMENT_OTHER)
Admission: RE | Admit: 2014-09-08 | Discharge: 2014-09-08 | Disposition: A | Discharge status: Home / Self Care | Payer: BLUE CROSS/BLUE SHIELD | Source: Ambulatory Visit | Attending: Physician Assistant | Admitting: Physician Assistant

## 2014-09-08 ENCOUNTER — Other Ambulatory Visit: Payer: Self-pay | Admitting: Physician Assistant

## 2014-09-08 DIAGNOSIS — N500 Atrophy of testis: Secondary | ICD-10-CM

## 2014-09-08 DIAGNOSIS — N50812 Left testicular pain: Secondary | ICD-10-CM

## 2014-09-08 DIAGNOSIS — Q539 Undescended testicle, unspecified: Secondary | ICD-10-CM | POA: Diagnosis not present

## 2014-09-08 DIAGNOSIS — N442 Benign cyst of testis: Secondary | ICD-10-CM | POA: Insufficient documentation

## 2014-09-08 DIAGNOSIS — Q53111 Unilateral intraabdominal testis: Secondary | ICD-10-CM

## 2014-09-08 DIAGNOSIS — N50819 Testicular pain, unspecified: Secondary | ICD-10-CM

## 2014-09-08 DIAGNOSIS — N508 Other specified disorders of male genital organs: Secondary | ICD-10-CM | POA: Diagnosis present

## 2014-09-14 ENCOUNTER — Encounter: Payer: Self-pay | Admitting: Family

## 2014-10-06 ENCOUNTER — Telehealth: Payer: Self-pay | Admitting: Family

## 2014-10-06 ENCOUNTER — Encounter: Payer: Self-pay | Admitting: Family

## 2014-10-06 ENCOUNTER — Ambulatory Visit: Payer: Medicare Other | Admitting: Family

## 2014-10-06 NOTE — Telephone Encounter (Signed)
No, please don't charge. thanks

## 2014-10-06 NOTE — Telephone Encounter (Signed)
Pt was no show this morning 10/06/14 8:15am, VM from today at 8:24am to cancel the appt, I tried to call but not able to leave a msg, mailing letter, do you want to charge for no show?

## 2014-10-07 ENCOUNTER — Encounter: Payer: Self-pay | Admitting: Family

## 2014-10-25 ENCOUNTER — Ambulatory Visit (INDEPENDENT_AMBULATORY_CARE_PROVIDER_SITE_OTHER): Payer: BLUE CROSS/BLUE SHIELD | Admitting: Family

## 2014-10-25 ENCOUNTER — Encounter: Payer: Self-pay | Admitting: Family

## 2014-10-25 VITALS — BP 138/88 | HR 67 | Temp 97.8°F | Resp 16 | Ht 72.0 in | Wt 233.6 lb

## 2014-10-25 DIAGNOSIS — M545 Low back pain: Secondary | ICD-10-CM | POA: Diagnosis not present

## 2014-10-25 DIAGNOSIS — E119 Type 2 diabetes mellitus without complications: Secondary | ICD-10-CM

## 2014-10-25 DIAGNOSIS — Z23 Encounter for immunization: Secondary | ICD-10-CM | POA: Diagnosis not present

## 2014-10-25 DIAGNOSIS — I10 Essential (primary) hypertension: Secondary | ICD-10-CM

## 2014-10-25 DIAGNOSIS — B351 Tinea unguium: Secondary | ICD-10-CM | POA: Diagnosis not present

## 2014-10-25 DIAGNOSIS — E785 Hyperlipidemia, unspecified: Secondary | ICD-10-CM

## 2014-10-25 LAB — BASIC METABOLIC PANEL
BUN: 15 mg/dL (ref 6–23)
CHLORIDE: 106 meq/L (ref 96–112)
CO2: 26 mEq/L (ref 19–32)
Calcium: 9.5 mg/dL (ref 8.4–10.5)
Creatinine, Ser: 1.11 mg/dL (ref 0.40–1.50)
GFR: 89.18 mL/min (ref >60.00)
Glucose, Bld: 109 mg/dL — ABNORMAL HIGH (ref 70–99)
Potassium: 3.8 mEq/L (ref 3.5–5.1)
Sodium: 141 mEq/L (ref 135–145)

## 2014-10-25 LAB — LIPID PANEL
CHOL/HDL RATIO: 5
CHOLESTEROL: 176 mg/dL (ref 0–200)
HDL: 39 mg/dL — AB (ref >39.00)
LDL Cholesterol: 114 mg/dL — ABNORMAL HIGH (ref 0–99)
NonHDL: 136.65
TRIGLYCERIDES: 111 mg/dL (ref 0.0–149.0)
VLDL: 22.2 mg/dL (ref 0.0–40.0)

## 2014-10-25 LAB — HEMOGLOBIN A1C: HEMOGLOBIN A1C: 5.9 % (ref 4.6–6.5)

## 2014-10-25 MED ORDER — DICLOFENAC SODIUM 75 MG PO TBEC: 75.0000 mg | DELAYED_RELEASE_TABLET | Freq: Two times a day (BID) | ORAL | Status: DC

## 2014-10-25 NOTE — Progress Notes (Signed)
Subjective:    Patient ID: Thomas Ortega, male    DOB: 04/23/1960, 54 y.o.   MRN: [id number]  HPI   Thomas Ortega is a 54 yr old male who presents today for follow up.  Patient presents today for follow up of multiple medical problems.  Diabetes Type 2  Pt is currently maintained on the following medications for diabetes: metformin  Lab Results  Component Value Date   HGBA1C 6.1 07/07/2014   HGBA1C 8.9* 10/14/2013   HGBA1C 6.0* 09/16/2012    Lab Results  Component Value Date   MICROALBUR 0.9 07/07/2014   LDLCALC 131* 07/07/2014   CREATININE 1.27 07/07/2014   Hyperlipidemia  Patient is currently maintained on the following medication for hyperlipidemia: atorvastatin 20mg  Last lipid panel as follows:  Lab Results  Component Value Date   CHOL 190 07/07/2014   HDL 36.80* 07/07/2014   LDLCALC 131* 07/07/2014   TRIG 110.0 07/07/2014   CHOLHDL 5 07/07/2014   Patient denies myalgia. Patient reports good compliance with low fat/low cholesterol diet.   Hypertension  Patient is currently maintained on the following medications for blood pressure: hctz, lisinopril Patient reports good compliance with blood pressure medications. Patient denies chest pain, shortness of breath or swelling. Last 3 blood pressure readings in our office are as follows: BP Readings from Last 3 Encounters:  10/25/14 138/88  09/06/14 144/82  07/07/14 120/84   Notes some dizziness with standing. This occurs if he bends down and then stands up.  This has been intermittent.     Back pain- ongoing.  Has seen neurosurgery in the past and they recommended ESI, however pt could not afford and he is trying to save up.     Onychomycosis- completed lamisil- no improvement.         Review of Systems See HPI  Past Medical History  Diagnosis Date  . Diabetes mellitus   . Hypertension   . Hepatitis B   . Nausea     History   Social History  . Marital Status: Married    Spouse Name: N/A    . Number of Children: 1  . Years of Education: N/A   Occupational History  . unemployed    Social History Main Topics  . Smoking status: Never Smoker   . Smokeless tobacco: Never Used  . Alcohol Use: No  . Drug Use: No  . Sexual Activity: Not on file   Other Topics Concern  . Not on file   Social History Narrative   Originally from [place]   Currently unemployed   Has 2 grown children who still live in [place]    Past Surgical History  Procedure Laterality Date  . Mass excision  2009    base of skull    Family History  Problem Relation Age of Onset  . Prostate cancer Neg Hx   . Colon cancer Neg Hx   . Breast cancer Neg Hx   . Heart disease Neg Hx   . Diabetes Neg Hx     No Known Allergies  Current Outpatient Prescriptions on File Prior to Visit  Medication Sig Dispense Refill  . atorvastatin (LIPITOR) 20 MG tablet Take 1 tablet (20 mg total) by mouth daily. 90 tablet 1  . hydrochlorothiazide (HYDRODIURIL) 25 MG tablet Take 1 tablet (25 mg total) by mouth daily. 90 tablet 1  . HYDROcodone-acetaminophen (NORCO) 10-325 MG per tablet Take 1 tablet by mouth every 8 (eight) hours as needed. 30 tablet 0  . lisinopril (  PRINIVIL,ZESTRIL) 10 MG tablet Take 1 tablet (10 mg total) by mouth daily. 90 tablet 1  . metFORMIN (GLUCOPHAGE) 1000 MG tablet TAKE ONE TABLET BY MOUTH TWICE DAILY WITH MEALS 180 tablet 1  . omeprazole (PRILOSEC) 40 MG capsule Take 1 capsule (40 mg total) by mouth daily. 90 capsule 1   No current facility-administered medications on file prior to visit.    BP 138/88 mmHg  Pulse 67  Temp(Src) 97.8 F (36.6 C) (Oral)  Resp 16  Ht 6' (1.829 m)  Wt 233 lb 9.6 oz (105.96 kg)  BMI 31.67 kg/m2  SpO2 99%       Objective:   Physical Exam  Constitutional: He is oriented to person, place, and time. He appears well-developed and well-nourished. No distress.  HENT:  Head: Normocephalic and atraumatic.  Cardiovascular: Normal rate and regular rhythm.    No murmur heard. Pulmonary/Chest: Effort normal and breath sounds normal. No respiratory distress. He has no wheezes. He has no rales.  Musculoskeletal: He exhibits no edema.  Neurological: He is alert and oriented to person, place, and time.  Skin: Skin is warm and dry.  Thickened, discolored toenails noted  Psychiatric: He has a normal mood and affect. His behavior is normal. Thought content normal.          Assessment & Plan:

## 2014-10-25 NOTE — Assessment & Plan Note (Signed)
Tolerating statin, obtain FLP.  

## 2014-10-25 NOTE — Addendum Note (Signed)
Addended by: Tasia Catchings on: 10/25/2014 09:08 AM   Modules accepted: Orders

## 2014-10-25 NOTE — Assessment & Plan Note (Signed)
Unchanged. Will refer to podiatry

## 2014-10-25 NOTE — Assessment & Plan Note (Signed)
Obtain a1c, continue metformin

## 2014-10-25 NOTE — Progress Notes (Signed)
Pre visit review using our clinic review tool, if applicable. No additional management support is needed unless otherwise documented below in the visit note. 

## 2014-10-25 NOTE — Assessment & Plan Note (Signed)
He is saving up for ESI. Refill provided for diclofenac.

## 2014-10-25 NOTE — Assessment & Plan Note (Signed)
Advised pt to ensure adequate hydration. Orthostatics reviewed- mild.  BP 423 systolic lying, 953 standing.

## 2014-10-25 NOTE — Patient Instructions (Signed)
Make sure you drink plenty of water in the heat. Please complete lab work prior to leaving.  Follow up in 3 months.

## 2014-10-27 ENCOUNTER — Telehealth: Payer: Self-pay | Admitting: *Deleted

## 2014-10-27 ENCOUNTER — Encounter: Payer: Self-pay | Admitting: Family

## 2014-10-27 DIAGNOSIS — E785 Hyperlipidemia, unspecified: Secondary | ICD-10-CM

## 2014-10-27 MED ORDER — ATORVASTATIN CALCIUM 40 MG PO TABS: 40.0000 mg | ORAL_TABLET | Freq: Every day | ORAL | Status: DC

## 2014-10-27 NOTE — Telephone Encounter (Signed)
Notified pt and he voices understanding.  New rx sent to pharmacy.  Lab appt scheduled for 12/08/14 at 7:30am, future lab order entered.

## 2014-10-27 NOTE — Telephone Encounter (Signed)
-----   Message from Debbrah Alar, NP sent at 10/27/2014  4:09 PM EDT ----- Sugar looks great.  LDL above goal. Increase atorvastatin from 20mg  to 40mg .  #30 with 2 refills. Repeat FLP in 6 weeks.

## 2014-12-08 ENCOUNTER — Telehealth: Payer: Self-pay | Admitting: Family

## 2014-12-08 ENCOUNTER — Other Ambulatory Visit (INDEPENDENT_AMBULATORY_CARE_PROVIDER_SITE_OTHER): Payer: BLUE CROSS/BLUE SHIELD

## 2014-12-08 DIAGNOSIS — Z5181 Encounter for therapeutic drug level monitoring: Secondary | ICD-10-CM

## 2014-12-08 DIAGNOSIS — E785 Hyperlipidemia, unspecified: Secondary | ICD-10-CM

## 2014-12-08 LAB — HEPATIC FUNCTION PANEL
ALT: 22 U/L (ref 0–53)
AST: 17 U/L (ref 0–37)
Albumin: 4.4 g/dL (ref 3.5–5.2)
Alkaline Phosphatase: 62 U/L (ref 39–117)
BILIRUBIN DIRECT: 0.1 mg/dL (ref 0.0–0.3)
BILIRUBIN TOTAL: 0.4 mg/dL (ref 0.2–1.2)
Total Protein: 7.5 g/dL (ref 6.0–8.3)

## 2014-12-08 LAB — LIPID PANEL
CHOL/HDL RATIO: 3
Cholesterol: 117 mg/dL (ref 0–200)
HDL: 39.2 mg/dL (ref >39.00)
LDL Cholesterol: 65 mg/dL (ref 0–99)
NonHDL: 77.43
Triglycerides: 62 mg/dL (ref 0.0–149.0)
VLDL: 12.4 mg/dL (ref 0.0–40.0)

## 2014-12-08 NOTE — Telephone Encounter (Signed)
Pt had lipid panel here today. No lavender tube drawn so will be unable to add CBC.  Please advise if ok to add hepatic function panel for [facility]?

## 2014-12-08 NOTE — Telephone Encounter (Signed)
Add on form faxed to the lab. Attempted to reach [place] at [facility] and left voicemail that hep fx has been added and unable to add CBC and to call if any further questions.

## 2014-12-08 NOTE — Telephone Encounter (Signed)
Caller name: Thomas Ortega ( [place]) nurse Relationship to patient:  Can be reached: [phone] Pharmacy:  Reason for call: she is calling in to see if Thomas Ortega could put in orders for a CBC w/ diff and also hepatic panel. She says that the pt had an appt there with them this morning but didn't want to wait for her to draw his blood so she was unable to get labs. She would like to have a call back just to be sure.

## 2014-12-08 NOTE — Telephone Encounter (Signed)
Ok (please use therapeutic drug monitoring). Thanks.

## 2014-12-09 ENCOUNTER — Encounter: Payer: Self-pay | Admitting: Family

## 2014-12-13 ENCOUNTER — Telehealth: Payer: Self-pay | Admitting: Family

## 2014-12-13 NOTE — Telephone Encounter (Signed)
Thomas Ortega with [facility] called to check on RX for lidocaine they requested (at the request of the patient). It was faxed 12/06/14 to [phone]. She is refaxing now to [phone]. Please notify them if ok to fill or deny.

## 2014-12-13 NOTE — Telephone Encounter (Signed)
Spoke with pt. He states this company called him and he is not sure how they got his information. States he is not requesting medication.

## 2014-12-22 ENCOUNTER — Telehealth: Payer: Self-pay | Admitting: Family

## 2014-12-22 NOTE — Telephone Encounter (Signed)
Caller name: Baker Janus  Relationship to patient: [place]  Can be reached: [phone]    Fax: [phone] Pharmacy:  Reason for call:  She is requesting a script for diabetic testing supply for the pt. She says that she would like to have that faxed to her if possible at the above fax number.

## 2014-12-22 NOTE — Telephone Encounter (Signed)
Spoke with Baker Janus, advised her that she needs to have pt contact our office if he is wanting to get these supplies through their company. She states she will contact pt.

## 2015-01-25 ENCOUNTER — Encounter: Payer: Self-pay | Admitting: Family

## 2015-01-25 ENCOUNTER — Ambulatory Visit (INDEPENDENT_AMBULATORY_CARE_PROVIDER_SITE_OTHER): Payer: 59 | Admitting: Family

## 2015-01-25 VITALS — BP 138/82 | HR 60 | Temp 98.5°F | Resp 16 | Ht 72.0 in | Wt 235.0 lb

## 2015-01-25 DIAGNOSIS — E785 Hyperlipidemia, unspecified: Secondary | ICD-10-CM

## 2015-01-25 DIAGNOSIS — G4710 Hypersomnia, unspecified: Secondary | ICD-10-CM

## 2015-01-25 DIAGNOSIS — R400 Somnolence: Secondary | ICD-10-CM | POA: Insufficient documentation

## 2015-01-25 DIAGNOSIS — M545 Low back pain: Secondary | ICD-10-CM

## 2015-01-25 DIAGNOSIS — Z5181 Encounter for therapeutic drug level monitoring: Secondary | ICD-10-CM

## 2015-01-25 DIAGNOSIS — E119 Type 2 diabetes mellitus without complications: Secondary | ICD-10-CM

## 2015-01-25 DIAGNOSIS — B351 Tinea unguium: Secondary | ICD-10-CM

## 2015-01-25 LAB — HEMOGLOBIN A1C: Hgb A1c MFr Bld: 6.1 % (ref 4.6–6.5)

## 2015-01-25 LAB — HEPATIC FUNCTION PANEL
ALT: 22 U/L (ref 0–53)
AST: 20 U/L (ref 0–37)
Albumin: 4.4 g/dL (ref 3.5–5.2)
Alkaline Phosphatase: 62 U/L (ref 39–117)
BILIRUBIN DIRECT: 0.1 mg/dL (ref 0.0–0.3)
BILIRUBIN TOTAL: 0.5 mg/dL (ref 0.2–1.2)
Total Protein: 7.6 g/dL (ref 6.0–8.3)

## 2015-01-25 LAB — BASIC METABOLIC PANEL
BUN: 16 mg/dL (ref 6–23)
CHLORIDE: 104 meq/L (ref 96–112)
CO2: 30 meq/L (ref 19–32)
Calcium: 9.9 mg/dL (ref 8.4–10.5)
Creatinine, Ser: 1.17 mg/dL (ref 0.40–1.50)
GFR: 83.84 mL/min (ref >60.00)
GLUCOSE: 119 mg/dL — AB (ref 70–99)
POTASSIUM: 3.7 meq/L (ref 3.5–5.1)
SODIUM: 141 meq/L (ref 135–145)

## 2015-01-25 MED ORDER — METHYLPREDNISOLONE 4 MG PO TBPK: ORAL_TABLET | ORAL | Status: DC

## 2015-01-25 MED ORDER — GLUCOSE BLOOD VI STRP: ORAL_STRIP | Status: AC

## 2015-01-25 MED ORDER — ACCU-CHEK FASTCLIX LANCETS MISC: Status: AC

## 2015-01-25 NOTE — Assessment & Plan Note (Signed)
Repeat lft on lamisil.

## 2015-01-25 NOTE — Addendum Note (Signed)
Addended by: Kelle Darting A on: 01/25/2015 09:03 AM   Modules accepted: Orders

## 2015-01-25 NOTE — Assessment & Plan Note (Signed)
I think his biggest issue is lack of adequate sleep.  We talked about the importance of 7 hours of sleep each night.  I asked him to ask his wife if he has any witnessed apneas.  If so, we could perform a sleep study, however I doubt OSA.  He scored 6 today on the [place] Sleepiness scale.

## 2015-01-25 NOTE — Progress Notes (Signed)
Subjective:    Patient ID: Thomas Ortega, male    DOB: Aug 13, 1960, 54 y.o.   MRN: [id number]  HPI  Thomas Ortega is a 54 yr old male who presents today for follow up.  1) DM2- the patient is maintained on metformin. He does not check his sugars.   Lab Results  Component Value Date   HGBA1C 5.9 10/25/2014   HGBA1C 6.1 07/07/2014   HGBA1C 8.9* 10/14/2013   Lab Results  Component Value Date   MICROALBUR 0.9 07/07/2014   LDLCALC 65 12/08/2014   CREATININE 1.11 10/25/2014  declines flu shot  2) HTN-  Pt is maintained on lisinopril.  Denies CP/SOB BP Readings from Last 3 Encounters:  01/25/15 138/82  10/25/14 138/88  09/06/14 144/82   3) Back pain-  Reports low back pain x 3-4 days.  Pain is worse with standing. Pain is more on the left side.  He is not using any medication right now for back pain.    4) Daytime somnolence- reports episodes of extreme sleepiness after eating.  + snoring.     Review of Systems See HPI  Past Medical History  Diagnosis Date  . Diabetes mellitus   . Hypertension   . Hepatitis B   . Nausea     Social History   Social History  . Marital Status: Married    Spouse Name: N/A  . Number of Children: 1  . Years of Education: N/A   Occupational History  . unemployed    Social History Main Topics  . Smoking status: Never Smoker   . Smokeless tobacco: Never Used  . Alcohol Use: No  . Drug Use: No  . Sexual Activity: Not on file   Other Topics Concern  . Not on file   Social History Narrative   Originally from [place]   Currently unemployed   Has 2 grown children who still live in [place]    Past Surgical History  Procedure Laterality Date  . Mass excision  2009    base of skull    Family History  Problem Relation Age of Onset  . Prostate cancer Neg Hx   . Colon cancer Neg Hx   . Breast cancer Neg Hx   . Heart disease Neg Hx   . Diabetes Neg Hx     No Known Allergies  Current Outpatient Prescriptions on File Prior to  Visit  Medication Sig Dispense Refill  . atorvastatin (LIPITOR) 40 MG tablet Take 1 tablet (40 mg total) by mouth daily. 90 tablet 0  . diclofenac (VOLTAREN) 75 MG EC tablet Take 1 tablet (75 mg total) by mouth 2 (two) times daily. 30 tablet 0  . hydrochlorothiazide (HYDRODIURIL) 25 MG tablet Take 1 tablet (25 mg total) by mouth daily. 90 tablet 1  . HYDROcodone-acetaminophen (NORCO) 10-325 MG per tablet Take 1 tablet by mouth every 8 (eight) hours as needed. 30 tablet 0  . lisinopril (PRINIVIL,ZESTRIL) 10 MG tablet Take 1 tablet (10 mg total) by mouth daily. 90 tablet 1  . metFORMIN (GLUCOPHAGE) 1000 MG tablet TAKE ONE TABLET BY MOUTH TWICE DAILY WITH MEALS 180 tablet 1  . omeprazole (PRILOSEC) 40 MG capsule Take 1 capsule (40 mg total) by mouth daily. 90 capsule 1   No current facility-administered medications on file prior to visit.    BP 138/82 mmHg  Pulse 60  Temp(Src) 98.5 F (36.9 C) (Oral)  Resp 16  Ht 6' (1.829 m)  Wt 235 lb (106.595 kg)  BMI  31.86 kg/m2  SpO2 100%       Objective:   Physical Exam  Constitutional: He is oriented to person, place, and time. He appears well-developed and well-nourished. No distress.  HENT:  Head: Normocephalic and atraumatic.  Cardiovascular: Normal rate and regular rhythm.   No murmur heard. Pulmonary/Chest: Effort normal and breath sounds normal. No respiratory distress. He has no wheezes. He has no rales.  Musculoskeletal: He exhibits no edema.  + left lower back tenderness to palpation  Neurological: He is alert and oriented to person, place, and time.  Reflex Scores:      Patellar reflexes are 3+ on the right side and 3+ on the left side. Bilateral LE strength is 5/5   Skin: Skin is warm and dry.  Psychiatric: He has a normal mood and affect. His behavior is normal. Thought content normal.          Assessment & Plan:

## 2015-01-25 NOTE — Progress Notes (Signed)
Pre visit review using our clinic review tool, if applicable. No additional management support is needed unless otherwise documented below in the visit note. 

## 2015-01-25 NOTE — Assessment & Plan Note (Signed)
BP stable on current meds.   

## 2015-01-25 NOTE — Assessment & Plan Note (Addendum)
Clinically stable on metformin.  Obtain follow up A1C.  Advised pt to watch sugars while on steroids.  See AVS- he is given a meter today and teaching by CMA on meter use.

## 2015-01-25 NOTE — Assessment & Plan Note (Signed)
rx with medrol dose pak.  Refer back to Dr. Joya Salm (neurosurgery). Previously ESI was recommended but insurance would not cover.  He now has a new insurance.

## 2015-01-25 NOTE — Assessment & Plan Note (Signed)
LDL at goal on statin, continue same.

## 2015-01-25 NOTE — Patient Instructions (Addendum)
Please complete lab work prior to leaving.   Make sure you are getting at least 7 hours of sleep each night. Start medrol dose pak for your back.  Please check sugars twice daily while on the medrol dose pak.  Call if sugar > 300 while you are taking the medrol dose pak. (this will raise your sugar temporarily) You will be contacted about your referral back to Dr. Joya Salm.

## 2015-01-26 ENCOUNTER — Encounter: Payer: Self-pay | Admitting: Family

## 2015-01-28 ENCOUNTER — Other Ambulatory Visit: Payer: Self-pay | Admitting: Family

## 2015-01-31 NOTE — Telephone Encounter (Signed)
Medication Detail      Disp Refills Start End     atorvastatin (LIPITOR) 40 MG tablet 90 tablet 0 10/27/2014     Sig - Route: Take 1 tablet (40 mg total) by mouth daily. - Oral    Notes to Pharmacy: PLEASE D/C 20 mg RX FROM 08/2014.    E-Prescribing Status: Receipt confirmed by pharmacy (10/27/2014 5:03 PM EDT)     Pharmacy    [id number] - [place], [place]   LAST OV: 01/25/15 Refill sent per [facility] refill protocol/SLS

## 2015-02-03 ENCOUNTER — Telehealth: Payer: Self-pay | Admitting: Family

## 2015-02-03 NOTE — Telephone Encounter (Signed)
Spoke with Christan at [facility], she is requesting the pt's A1C labs.  Faxed to [phone].

## 2015-02-07 ENCOUNTER — Telehealth: Payer: Self-pay

## 2015-02-07 DIAGNOSIS — R400 Somnolence: Secondary | ICD-10-CM

## 2015-02-07 NOTE — Telephone Encounter (Signed)
Also spoke with pt re: report for disability. He states it is time to renew his disability and he is unsure exactly what is needed. Advised him that disability services will usually send a request for medical records when it is time to renew disability. Also asked pt what he is currently on disability for and he states it is for lower right side back pain. He is unsure of the doctor's names that originally helped him get disability.  Pt states that he will give our contact information to the department of disability to let us know what is needed. Please advise re: sleep study?

## 2015-02-07 NOTE — Telephone Encounter (Signed)
Placed sleep study order. He will be contacted to arrange.

## 2015-02-07 NOTE — Telephone Encounter (Signed)
Patient states he is supposed to call you back with report of how he sleeps at HS. States per wife he has shortness of breath and periodically stops breathing during the night. Patient also states he needs a Drs' report for the past year for Disability.

## 2015-02-09 ENCOUNTER — Telehealth: Payer: Self-pay | Admitting: Family

## 2015-02-09 NOTE — Telephone Encounter (Signed)
Relation to [id number]  Call back number: [phone]   Reason for call: in  Patient wanted to inform PCP he decided to see Dr. Zonia Kief from [facility]osis in need of referral.

## 2015-02-09 NOTE — Telephone Encounter (Signed)
Received office note, does not need another referral per patient. Just wanted to let dr know

## 2015-03-13 ENCOUNTER — Other Ambulatory Visit: Payer: Self-pay | Admitting: Family

## 2015-04-17 ENCOUNTER — Ambulatory Visit (HOSPITAL_BASED_OUTPATIENT_CLINIC_OR_DEPARTMENT_OTHER): Payer: 59 | Attending: Family

## 2015-04-17 VITALS — Ht 74.0 in | Wt 240.0 lb

## 2015-04-17 DIAGNOSIS — R0683 Snoring: Secondary | ICD-10-CM | POA: Diagnosis not present

## 2015-04-17 DIAGNOSIS — G4719 Other hypersomnia: Secondary | ICD-10-CM | POA: Insufficient documentation

## 2015-04-17 DIAGNOSIS — R400 Somnolence: Secondary | ICD-10-CM

## 2015-04-17 DIAGNOSIS — G4730 Sleep apnea, unspecified: Secondary | ICD-10-CM | POA: Diagnosis not present

## 2015-04-20 ENCOUNTER — Telehealth: Payer: Self-pay | Admitting: Family

## 2015-04-20 DIAGNOSIS — G4733 Obstructive sleep apnea (adult) (pediatric): Secondary | ICD-10-CM | POA: Diagnosis not present

## 2015-04-20 NOTE — Progress Notes (Signed)
Patient Name: Siah, Mews Date: 04/17/2015 Gender: Male D.O.B: 12-03-1960 Age (years): 48 Referring Provider: Earlie Counts Height (inches): 74 Interpreting Physician: Kara Mead MD, ABSM Weight (lbs): 243 RPSGT: Madelon Lips BMI: 31 MRN: [id number] Neck Size: 17.50   CLINICAL INFORMATION Sleep Study Type: NPSG Indication for sleep study: Excessive Daytime Sleepiness, Witnessed apneas per wife Epworth Sleepiness Score: 5   SLEEP STUDY TECHNIQUE As per the AASM Manual for the Scoring of Sleep and Associated Events v2.3 (April 2016) with a hypopnea requiring 4% desaturations. The channels recorded and monitored were frontal, central and occipital EEG, electrooculogram (EOG), submentalis EMG (chin), nasal and oral airflow, thoracic and abdominal wall motion, anterior tibialis EMG, snore microphone, electrocardiogram, and pulse oximetry.   MEDICATIONS  Medications self-administered by patient during sleep study : No sleep medicine administered.   SLEEP ARCHITECTURE The study was initiated at 9:44:15 PM and ended at 3:49:37 AM. Sleep onset time was 2.6 minutes and the sleep efficiency was 81.4%. The total sleep time was 297.5 minutes. Stage REM latency was 23.5 minutes. The patient spent 6.22% of the night in stage N1 sleep, 76.97% in stage N2 sleep, 1.01% in stage N3 and 15.80% in REM. Alpha intrusion was absent. Supine sleep was 0.67%.   RESPIRATORY PARAMETERS The overall apnea/hypopnea index (AHI) was 3.0 per hour. There were 2 total apneas, including 2 obstructive, 0 central and 0 mixed apneas. There were 13 hypopneas and 9 RERAs. The AHI during Stage REM sleep was 1.3 per hour. AHI while supine was 0.0 per hour. The mean oxygen saturation was 94.59%. The minimum SpO2 during sleep was 83.00%. Loud snoring was noted during this study.   CARDIAC DATA The 2 lead EKG demonstrated sinus rhythm. The mean heart rate was 67.08 beats per minute. Other EKG findings  include: None.   LEG MOVEMENT DATA The total PLMS were 0 with a resulting PLMS index of 0.00. Associated arousal with leg movement index was 0.0 .   IMPRESSIONS - No significant obstructive sleep apnea occurred during this study (AHI = 3.0/h). - No significant central sleep apnea occurred during this study (CAI = 0.0/h). - Mild oxygen desaturation was noted during this study (Min O2 = 83.00%). - The patient snored with Loud snoring volume. - No cardiac abnormalities were noted during this study. - Clinically significant periodic limb movements did not occur during sleep. No significant associated arousals.   DIAGNOSIS - No evidence of sleep-disordered breathing   RECOMMENDATIONS - Avoid alcohol, sedatives and other CNS depressants that may worsen sleep apnea and disrupt normal sleep architecture. - Sleep hygiene should be reviewed to assess factors that may improve sleep quality. - Weight management and regular exercise should be initiated or continued if appropriate.  Kara Mead MD. [facility]. [facility]

## 2015-04-20 NOTE — Telephone Encounter (Signed)
Please let patient know that his sleep study does not show sleep apnea. Good news.

## 2015-04-22 NOTE — Telephone Encounter (Signed)
Left detailed message on pt's voicemail and to call if any questions. 

## 2015-07-14 ENCOUNTER — Telehealth: Payer: Self-pay | Admitting: *Deleted

## 2015-07-14 NOTE — Telephone Encounter (Signed)
Pt signed ROI received via fax from [facility]. Forwarded to Jordan to scan/email to medical records. JG//CMA  

## 2015-07-21 ENCOUNTER — Other Ambulatory Visit: Payer: Self-pay | Admitting: Family

## 2015-09-13 ENCOUNTER — Other Ambulatory Visit: Payer: Self-pay | Admitting: Internal Medicine

## 2015-09-13 DIAGNOSIS — R42 Dizziness and giddiness: Secondary | ICD-10-CM

## 2015-09-13 DIAGNOSIS — M545 Low back pain: Secondary | ICD-10-CM

## 2015-09-29 ENCOUNTER — Telehealth: Payer: Self-pay | Admitting: Family

## 2015-09-29 NOTE — Telephone Encounter (Signed)
Please call Pt, he is overdue for routine follow-up w/ Melissa. No further refills until seen. Thank you.

## 2015-10-03 ENCOUNTER — Other Ambulatory Visit: Payer: 59

## 2015-10-06 NOTE — Telephone Encounter (Signed)
Left detailed voicemail to schedule appt.

## 2015-10-17 ENCOUNTER — Other Ambulatory Visit: Payer: 59

## 2015-10-21 ENCOUNTER — Ambulatory Visit
Admission: RE | Admit: 2015-10-21 | Discharge: 2015-10-21 | Disposition: A | Discharge status: Home / Self Care | Payer: 59 | Source: Ambulatory Visit | Attending: Internal Medicine | Admitting: Internal Medicine

## 2015-10-21 DIAGNOSIS — R42 Dizziness and giddiness: Secondary | ICD-10-CM

## 2015-10-21 DIAGNOSIS — M545 Low back pain: Secondary | ICD-10-CM

## 2015-10-21 MED ORDER — GADOBENATE DIMEGLUMINE 529 MG/ML IV SOLN
20.0000 mL | Freq: Once | INTRAVENOUS | Status: AC | PRN
  Administered 2015-10-21: 20 mL via INTRAVENOUS

## 2016-07-16 ENCOUNTER — Ambulatory Visit (INDEPENDENT_AMBULATORY_CARE_PROVIDER_SITE_OTHER): Payer: 59

## 2016-07-16 ENCOUNTER — Ambulatory Visit (INDEPENDENT_AMBULATORY_CARE_PROVIDER_SITE_OTHER): Payer: 59 | Admitting: Physician Assistant

## 2016-07-16 VITALS — BP 126/86 | HR 74 | Temp 97.9°F | Resp 16 | Ht 72.0 in | Wt 221.2 lb

## 2016-07-16 DIAGNOSIS — M542 Cervicalgia: Secondary | ICD-10-CM | POA: Diagnosis not present

## 2016-07-16 MED ORDER — MELOXICAM 15 MG PO TABS: 7.5000 mg | ORAL_TABLET | Freq: Every day | ORAL | 0 refills | Status: DC

## 2016-07-16 MED ORDER — CYCLOBENZAPRINE HCL 10 MG PO TABS: 5.0000 mg | ORAL_TABLET | Freq: Three times a day (TID) | ORAL | 0 refills | Status: DC | PRN

## 2016-07-16 NOTE — Patient Instructions (Signed)
     IF you received an x-ray today, you will receive an invoice from [facility]. Please contact [facility] at [phone] with questions or concerns regarding your invoice.   IF you received labwork today, you will receive an invoice from [facility]. Please contact [facility] at [phone] with questions or concerns regarding your invoice.   Our billing staff will not be able to assist you with questions regarding bills from these companies.  You will be contacted with the lab results as soon as they are available. The fastest way to get your results is to activate your My Chart account. Instructions are located on the last page of this paperwork. If you have not heard from us regarding the results in 2 weeks, please contact this office.     
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
## 2016-08-14 ENCOUNTER — Other Ambulatory Visit: Payer: Self-pay | Admitting: Physician Assistant

## 2016-08-14 DIAGNOSIS — M542 Cervicalgia: Secondary | ICD-10-CM

## 2016-12-17 ENCOUNTER — Ambulatory Visit (INDEPENDENT_AMBULATORY_CARE_PROVIDER_SITE_OTHER): Payer: 59 | Admitting: Medical

## 2016-12-17 ENCOUNTER — Encounter: Payer: Self-pay | Admitting: Medical

## 2016-12-17 VITALS — BP 140/72 | HR 66 | Ht 72.0 in | Wt 220.0 lb

## 2016-12-17 DIAGNOSIS — E785 Hyperlipidemia, unspecified: Secondary | ICD-10-CM | POA: Diagnosis not present

## 2016-12-17 DIAGNOSIS — R10819 Abdominal tenderness, unspecified site: Secondary | ICD-10-CM

## 2016-12-17 DIAGNOSIS — K429 Umbilical hernia without obstruction or gangrene: Secondary | ICD-10-CM | POA: Diagnosis not present

## 2016-12-17 DIAGNOSIS — G8929 Other chronic pain: Secondary | ICD-10-CM

## 2016-12-17 DIAGNOSIS — M549 Dorsalgia, unspecified: Secondary | ICD-10-CM

## 2016-12-17 DIAGNOSIS — I10 Essential (primary) hypertension: Secondary | ICD-10-CM

## 2016-12-17 DIAGNOSIS — E1169 Type 2 diabetes mellitus with other specified complication: Secondary | ICD-10-CM | POA: Diagnosis not present

## 2016-12-17 DIAGNOSIS — E782 Mixed hyperlipidemia: Secondary | ICD-10-CM

## 2016-12-17 DIAGNOSIS — B356 Tinea cruris: Secondary | ICD-10-CM | POA: Insufficient documentation

## 2016-12-17 MED ORDER — TERBINAFINE HCL 1 % EX CREA: 1.0000 "application " | TOPICAL_CREAM | Freq: Two times a day (BID) | CUTANEOUS | 0 refills | Status: DC

## 2016-12-17 NOTE — Patient Instructions (Addendum)
Encounter Diagnoses  Name Primary?  . DM type 2 with diabetic mixed hyperlipidemia ([place]) Yes  . Essential hypertension   . Hyperlipidemia, unspecified hyperlipidemia type   . Tinea cruris   . Chronic back pain, unspecified back location, unspecified back pain laterality   . Umbilical hernia without obstruction and without gangrene   . Abdominal tenderness, rebound tenderness presence not specified, unspecified location     Recommendations:  We will call with lab results in the next day or 2  I plan to restart medications for diabetes, blood pressure and cholesterol  Begin Lamisil cream to the scrotum daily for 2-3 weeks until rash resolves  Exercise with walking or bicycle or swimming.  Avoid high impact exercising such as running or jumping or hard pavement exercise  Use core strengthening exercises to strengthen the low back and abdomen.     Examples include core twists, crunches, dead lifts, rows, sit ups, and stretching the back in general  If you back continues to bother you, we can consider physical therapy referral  You can use over the counter Tylenol for pain  If you are having a lot of pain at the belly button, we can consider other evaluation  Check you sugars daily.  Fasting sugars should be under 130 in the mornings  Lets plan to see you every 3 - 6 months depending upon how well your sugars are controlled

## 2016-12-17 NOTE — Progress Notes (Signed)
Subjective: Chief Complaint  Patient presents with  . New Patient (Initial Visit)    new pt, rash around penis , hx of dm and back pain    Here as a new patient for diabetes, rash, back issues.  Was going to [facility] in [place].   Last visit there 7 months ago.   Is completely out of medication.  Diabetes - has been on Metformin since before 2012.  Currently takes Metformin, was on Janumet at one point but it was too expensive.  No glucometer currently.   +polyuria, no polydipsia, no blurred vision.   Last labs 61mo ago.    HTN - has been on Lisinopril and HCTZ in the past.   Not checking BPs, no cuff.  He notes rash around "balls."    Intermittent, its itchy.  Only on scrotum, no penis.  Using nothing for this.     No prior heart issues.   Had prior sleep study, 8 months ago, reportedly normal.   Gets back pain, has hx/o chronic back pain.  No leg pains.   Gets pains on left and right lateral back.  No numbness or tingling in feet.   Had MRI L spine 09/2015.    Has had EDSI in the past.   Sometimes exercises with treadmill.   No stretching.   No weights or calisthenics.    Past Medical History:  Diagnosis Date  . Diabetes mellitus   . Hepatitis B   . Hypertension   . Nausea    Current Outpatient Prescriptions on File Prior to Visit  Medication Sig Dispense Refill  . ACCU-CHEK FASTCLIX LANCETS MISC Use to check blood sugar 1-2 times daily.  Dx  E11.9 100 each 2  . atorvastatin (LIPITOR) 40 MG tablet TAKE 1 TABLET BY MOUTH DAILY. (Patient not taking: Reported on 07/16/2016) 90 tablet 0  . cyclobenzaprine (FLEXERIL) 10 MG tablet Take 0.5-1 tablets (5-10 mg total) by mouth 3 (three) times daily as needed for muscle spasms (May cause drowsiness. Do no operate heavy machinery while taking.). (Patient not taking: Reported on 12/17/2016) 30 tablet 0  . diclofenac (VOLTAREN) 75 MG EC tablet Take 1 tablet (75 mg total) by mouth 2 (two) times daily. (Patient not taking: Reported on  07/16/2016) 30 tablet 0  . glucose blood (ACCU-CHEK SMARTVIEW) test strip Use as instructed to check blood sugar 1-2 times a day. (Patient not taking: Reported on 12/17/2016) 100 each 2  . hydrochlorothiazide (HYDRODIURIL) 25 MG tablet TAKE 1 TABLET BY MOUTH DAILY. (Patient not taking: Reported on 07/16/2016) 90 tablet 1  . HYDROcodone-acetaminophen (NORCO) 10-325 MG per tablet Take 1 tablet by mouth every 8 (eight) hours as needed. (Patient not taking: Reported on 07/16/2016) 30 tablet 0  . metFORMIN (GLUCOPHAGE) 1000 MG tablet Take 1 tablet (1,000 mg total) by mouth 2 (two) times daily with a meal. (Patient not taking: Reported on 12/17/2016) 60 tablet 0   No current facility-administered medications on file prior to visit.    ROS as in subjective   Objective: BP 140/72   Pulse 66   Ht 6' (1.829 m)   Wt 220 lb (99.8 kg)   SpO2 99%   BMI 29.84 kg/m   General appearance: alert, no distress, WD/WN, AA male HEENT: normocephalic, sclerae anicteric, PERRLA, EOMi, nares patent, no discharge or erythema, pharynx normal Oral cavity: MMM, no lesions Neck: supple, no lymphadenopathy, no thyromegaly, no masses Heart: RRR, normal S1, S2, no murmurs Lungs: CTA bilaterally, no wheezes, rhonchi,  or rales Abdomen: +bs, soft, mild epigastric tenderness, mild umbilical tenderness, there is a small reducible 1cm hernia at umbilicus, otherwise non tender, non distended, no masses, no hepatomegaly, no splenomegaly Back: mildly tender over midline and right lumbar spine, mild pain reported with ROM which is about 90% of normal.  No deformity Musculoskeletal: nontender, no swelling, no obvious deformity Extremities: no edema, no cyanosis, no clubbing Pulses: 2+ symmetric, upper and lower extremities, normal cap refill Neurological: alert, oriented x 3, CN2-12 intact, leg strength, sensation DTRs within normal Psychiatric: normal affect, behavior normal, pleasant     Assessment: Encounter Diagnoses  Name  Primary?  . DM type 2 with diabetic mixed hyperlipidemia ([place]) Yes  . Essential hypertension   . Hyperlipidemia, unspecified hyperlipidemia type   . Tinea cruris   . Chronic back pain, unspecified back location, unspecified back pain laterality   . Umbilical hernia without obstruction and without gangrene   . Abdominal tenderness, rebound tenderness presence not specified, unspecified location     Plan: Diabetes - update labs today.  Prior records show prior Metformin and Janumet prior. Gave script for glucometer and testing supplies HTN - pending labs, likely restart Losartan Hyperlipidemia - pending labs, likely restart statin tinea cruris - begin cream below, may take a few weeks to resolve Chronic back pain - reviewed 09/2015 MRI with abnormality.  Discussed routine exercise, core strengthen but avoiding high impact activity.   Can use OTC analgesics.  Prior treatments have included EDSI. Abdominal pain, small reducible hernia - pending labs, consider imaging, referral to general surgery He declines flu shot today  Thomas Ortega was seen today for new patient (initial visit).  Diagnoses and all orders for this visit:  DM type 2 with diabetic mixed hyperlipidemia ([place]) -     Comprehensive metabolic panel -     Lipid panel -     CBC -     Hemoglobin A1c -     HM DIABETES EYE EXAM -     Microalbumin / creatinine urine ratio  Essential hypertension  Hyperlipidemia, unspecified hyperlipidemia type -     Lipid panel  Tinea cruris  Chronic back pain, unspecified back location, unspecified back pain laterality  Umbilical hernia without obstruction and without gangrene  Abdominal tenderness, rebound tenderness presence not specified, unspecified location  Other orders -     terbinafine (LAMISIL AT) 1 % cream; Apply 1 application topically 2 (two) times daily.

## 2016-12-18 ENCOUNTER — Other Ambulatory Visit: Payer: Self-pay | Admitting: Medical

## 2016-12-18 DIAGNOSIS — M542 Cervicalgia: Secondary | ICD-10-CM

## 2016-12-18 LAB — COMPREHENSIVE METABOLIC PANEL
AG Ratio: 1.7 (calc) (ref 1.0–2.5)
ALBUMIN MSPROF: 4.6 g/dL (ref 3.6–5.1)
ALT: 17 U/L (ref 9–46)
AST: 17 U/L (ref 10–35)
Alkaline phosphatase (APISO): 73 U/L (ref 40–115)
BUN: 16 mg/dL (ref 7–25)
CHLORIDE: 106 mmol/L (ref 98–110)
CO2: 27 mmol/L (ref 20–32)
CREATININE: 1.03 mg/dL (ref 0.70–1.33)
Calcium: 9.4 mg/dL (ref 8.6–10.3)
GLOBULIN: 2.7 g/dL (ref 1.9–3.7)
GLUCOSE: 99 mg/dL (ref 65–99)
POTASSIUM: 3.9 mmol/L (ref 3.5–5.3)
Sodium: 141 mmol/L (ref 135–146)
Total Bilirubin: 0.5 mg/dL (ref 0.2–1.2)
Total Protein: 7.3 g/dL (ref 6.1–8.1)

## 2016-12-18 LAB — CBC
HEMATOCRIT: 45 % (ref 38.5–50.0)
HEMOGLOBIN: 15.4 g/dL (ref 13.2–17.1)
MCH: 27.7 pg (ref 27.0–33.0)
MCHC: 34.2 g/dL (ref 32.0–36.0)
MCV: 81.1 fL (ref 80.0–100.0)
MPV: 11.4 fL (ref 7.5–12.5)
Platelets: 141 10*3/uL (ref 140–400)
RBC: 5.55 10*6/uL (ref 4.20–5.80)
RDW: 14.8 % (ref 11.0–15.0)
WBC: 4.9 10*3/uL (ref 3.8–10.8)

## 2016-12-18 LAB — LIPID PANEL
CHOL/HDL RATIO: 4 (calc) (ref <5.0)
Cholesterol: 193 mg/dL (ref <200)
HDL: 48 mg/dL (ref >40)
LDL CHOLESTEROL (CALC): 127 mg/dL — AB
NON-HDL CHOLESTEROL (CALC): 145 mg/dL — AB (ref <130)
Triglycerides: 83 mg/dL (ref <150)

## 2016-12-18 LAB — HEMOGLOBIN A1C
EAG (MMOL/L): 6.2 (calc)
Hgb A1c MFr Bld: 5.5 % of total Hgb (ref <5.7)
Mean Plasma Glucose: 111 (calc)

## 2016-12-18 LAB — MICROALBUMIN / CREATININE URINE RATIO
Creatinine, Urine: 173 mg/dL (ref 20–320)
Microalb Creat Ratio: 3 mcg/mg creat (ref <30)
Microalb, Ur: 0.5 mg/dL

## 2016-12-18 MED ORDER — LOSARTAN POTASSIUM 25 MG PO TABS: 25.0000 mg | ORAL_TABLET | Freq: Every day | ORAL | 0 refills | Status: DC

## 2016-12-18 MED ORDER — ATORVASTATIN CALCIUM 40 MG PO TABS
40.0000 mg | ORAL_TABLET | Freq: Every day | ORAL | 0 refills | Status: DC
Start: 2016-12-18 — End: 2017-03-13

## 2016-12-18 MED ORDER — CYCLOBENZAPRINE HCL 10 MG PO TABS: 5.0000 mg | ORAL_TABLET | Freq: Every day | ORAL | 0 refills | Status: DC

## 2017-01-08 ENCOUNTER — Telehealth: Payer: Self-pay | Admitting: Medical

## 2017-01-08 ENCOUNTER — Other Ambulatory Visit: Payer: Self-pay | Admitting: Medical

## 2017-01-08 MED ORDER — METFORMIN HCL 1000 MG PO TABS: 1000.0000 mg | ORAL_TABLET | Freq: Two times a day (BID) | ORAL | 2 refills | Status: DC

## 2017-01-08 NOTE — Telephone Encounter (Signed)
Pt is a new pt with his first visit in September. Pt did not need metformin refills at that time. Pt now states he does. Metformin is not of pt's medication list however it is Thomas Ortega's notes for that visit. [facility] sent [facility] [address]

## 2017-01-08 NOTE — Telephone Encounter (Signed)
Can pt have a refill on this 

## 2017-01-21 ENCOUNTER — Encounter: Payer: Self-pay | Admitting: Medical

## 2017-01-21 ENCOUNTER — Ambulatory Visit (INDEPENDENT_AMBULATORY_CARE_PROVIDER_SITE_OTHER): Payer: 59 | Admitting: Medical

## 2017-01-21 VITALS — BP 140/86 | HR 76 | Temp 98.1°F | Wt 227.8 lb

## 2017-01-21 DIAGNOSIS — J069 Acute upper respiratory infection, unspecified: Secondary | ICD-10-CM | POA: Diagnosis not present

## 2017-01-21 DIAGNOSIS — E782 Mixed hyperlipidemia: Secondary | ICD-10-CM | POA: Diagnosis not present

## 2017-01-21 DIAGNOSIS — I10 Essential (primary) hypertension: Secondary | ICD-10-CM

## 2017-01-21 DIAGNOSIS — M79671 Pain in right foot: Secondary | ICD-10-CM | POA: Diagnosis not present

## 2017-01-21 DIAGNOSIS — L309 Dermatitis, unspecified: Secondary | ICD-10-CM | POA: Diagnosis not present

## 2017-01-21 DIAGNOSIS — E1169 Type 2 diabetes mellitus with other specified complication: Secondary | ICD-10-CM | POA: Diagnosis not present

## 2017-01-21 LAB — COMPREHENSIVE METABOLIC PANEL
AG Ratio: 1.6 (calc) (ref 1.0–2.5)
ALBUMIN MSPROF: 4.5 g/dL (ref 3.6–5.1)
ALKALINE PHOSPHATASE (APISO): 67 U/L (ref 40–115)
ALT: 19 U/L (ref 9–46)
AST: 17 U/L (ref 10–35)
BUN: 20 mg/dL (ref 7–25)
CO2: 27 mmol/L (ref 20–32)
CREATININE: 1.03 mg/dL (ref 0.70–1.33)
Calcium: 9.7 mg/dL (ref 8.6–10.3)
Chloride: 105 mmol/L (ref 98–110)
Globulin: 2.9 g/dL (calc) (ref 1.9–3.7)
Glucose, Bld: 97 mg/dL (ref 65–99)
Potassium: 4.1 mmol/L (ref 3.5–5.3)
Sodium: 141 mmol/L (ref 135–146)
Total Bilirubin: 0.6 mg/dL (ref 0.2–1.2)
Total Protein: 7.4 g/dL (ref 6.1–8.1)

## 2017-01-21 LAB — LIPID PANEL
CHOL/HDL RATIO: 2.8 (calc) (ref <5.0)
CHOLESTEROL: 141 mg/dL (ref <200)
HDL: 51 mg/dL (ref >40)
LDL CHOLESTEROL (CALC): 74 mg/dL
Non-HDL Cholesterol (Calc): 90 mg/dL (calc) (ref <130)
Triglycerides: 82 mg/dL (ref <150)

## 2017-01-21 MED ORDER — CLOTRIMAZOLE-BETAMETHASONE 1-0.05 % EX CREA: 1.0000 "application " | TOPICAL_CREAM | Freq: Two times a day (BID) | CUTANEOUS | 0 refills | Status: DC

## 2017-01-21 NOTE — Progress Notes (Signed)
Subjective: Chief Complaint  Patient presents with  . Follow-up    follow up 1 month dm,  feels like he is getting a cold.   Here for chronic disease f/u and other concerns.  Last visit we restarted Lipitor and Losartan.  Tolerating both well without c/o.  Has hx/o diet controlled diabetes, HTN, hyperlipidemia.    Last visit I saw him for scrotal itching.  Despite using OTC Lamisil, still has some itching.  Not quite as bad as last time and itching is intermittent.  He wears briefs.  He reports 3 day hx/o right heel pain without injury fall or trauma.    He reports throat itching, nose congested x 3-4 days.  Doesn't tolerate heat.  Wife recently turned heat on in the house.   No fever.   Has some cough.  Using nothing for those symptoms.   No animals in the house.  No other aggravating or relieving factors. No other complaint.   Past Medical History:  Diagnosis Date  . Diabetes mellitus   . Hepatitis B   . Hypertension   . Nausea    Current Outpatient Prescriptions on File Prior to Visit  Medication Sig Dispense Refill  . ACCU-CHEK FASTCLIX LANCETS MISC Use to check blood sugar 1-2 times daily.  Dx  E11.9 100 each 2  . atorvastatin (LIPITOR) 40 MG tablet Take 1 tablet (40 mg total) by mouth daily. 90 tablet 0  . cyclobenzaprine (FLEXERIL) 10 MG tablet Take 0.5-1 tablets (5-10 mg total) by mouth at bedtime. prn 15 tablet 0  . glucose blood (ACCU-CHEK SMARTVIEW) test strip Use as instructed to check blood sugar 1-2 times a day. 100 each 2  . losartan (COZAAR) 25 MG tablet Take 1 tablet (25 mg total) by mouth daily. 90 tablet 0  . metFORMIN (GLUCOPHAGE) 1000 MG tablet Take 1 tablet (1,000 mg total) by mouth 2 (two) times daily with a meal. 60 tablet 2   No current facility-administered medications on file prior to visit.    ROS as in subjective   Objective: BP 140/86   Pulse 76   Temp 98.1 F (36.7 C)   Wt 227 lb 12.8 oz (103.3 kg)   SpO2 98%   BMI 30.90 kg/m   BP  Readings from Last 3 Encounters:  01/21/17 140/86  12/17/16 140/72  07/16/16 126/86   Wt Readings from Last 3 Encounters:  01/21/17 227 lb 12.8 oz (103.3 kg)  12/17/16 220 lb (99.8 kg)  07/16/16 221 lb 3.2 oz (100.3 kg)    General appearance: alert, no distress, WD/WN, AA male Skin: few small skin cysts of scrotum, and there is some pink coloration along scrotum suggestive of possible tinea, no other rash, no adjacent upper thigh rash Tender right heel volar surface, otherwise no deformity or other area of tenderness, his arches are reasonable, not flat feet, but not high arch either.  Otherwise feet and ankle exam unremarkable HEENT: normocephalic, sclerae anicteric, TMs pearly, nares with turbinated edema and clear discharge, mild erythema, pharynx normal Oral cavity: MMM, no lesions Neck: supple, no lymphadenopathy, no thyromegaly, no masses Heart: RRR, normal S1, S2, no murmurs Lungs: CTA bilaterally, no wheezes, rhonchi, or rales Ext: no edema Pulses: 2+ symmetric, upper and lower extremities, normal cap refill    Assessment: Encounter Diagnoses  Name Primary?  Marland Kitchen Upper respiratory tract infection, unspecified type Yes  . Dermatitis   . Pain of right heel   . Essential hypertension   . DM type 2  with diabetic mixed hyperlipidemia ([place])     Plan: Discussed his concerns, symptoms, medications, chronic issues.  Labs today  Other recommendations discussed as below  Patient Instructions  Recommendations:  Heel pain  This may be due to bone spur in the heel or inflammation of the tendons and connective tissue in the foot called plantar fascitis  I recommend getting some over the counter Heel Cups to wear in your work boots  You can also use a towel to stretch the ball of the foot daily  Consider using a tennis ball massage every morning  You can use cool pack x 20 minutes daily or lotion with peppermint oil to help with discomfort  Cough, congestion  Begin  Coricidin HBP Cough and Cold tablet and/or Flonase nasal spray over the counter for 7- 10 days to help with cough and congestion  Consider using nasal saline spray or flush to remove mucous from the nose  Hydrate well with water  If not improving within a week, let me know  Scrotal Itching  Consider wearing boxer underwear for now instead of briefs  Begin trial of Lotrisone cream for up to 2 weeks.  If not much improved within 2 weeks, then let me know   We will call with lab results   [place]lin was seen today for follow-up.  Diagnoses and all orders for this visit:  Upper respiratory tract infection, unspecified type  Dermatitis  Pain of right heel  Essential hypertension -     Comprehensive metabolic panel -     Lipid panel  DM type 2 with diabetic mixed hyperlipidemia ([facility]) -     Comprehensive metabolic panel -     Lipid panel  Other orders -     clotrimazole-betamethasone (LOTRISONE) cream; Apply 1 application topically 2 (two) times daily.

## 2017-01-21 NOTE — Patient Instructions (Signed)
Recommendations:  Heel pain  This may be due to bone spur in the heel or inflammation of the tendons and connective tissue in the foot called plantar fascitis  I recommend getting some over the counter Heel Cups to wear in your work boots  You can also use a towel to stretch the ball of the foot daily  Consider using a tennis ball massage every morning  You can use cool pack x 20 minutes daily or lotion with peppermint oil to help with discomfort  Cough, congestion  Begin Coricidin HBP Cough and Cold tablet and/or Flonase nasal spray over the counter for 7- 10 days to help with cough and congestion  Consider using nasal saline spray or flush to remove mucous from the nose  Hydrate well with water  If not improving within a week, let me know  Scrotal Itching  Consider wearing boxer underwear for now instead of briefs  Begin trial of Lotrisone cream for up to 2 weeks.  If not much improved within 2 weeks, then let me know   We will call with lab results

## 2017-01-22 ENCOUNTER — Other Ambulatory Visit: Payer: Self-pay | Admitting: Medical

## 2017-01-22 MED ORDER — LOSARTAN POTASSIUM 50 MG PO TABS: 50.0000 mg | ORAL_TABLET | Freq: Every day | ORAL | 1 refills | Status: DC

## 2017-02-04 ENCOUNTER — Telehealth: Payer: Self-pay | Admitting: Medical

## 2017-02-04 MED ORDER — METFORMIN HCL 1000 MG PO TABS: 1000.0000 mg | ORAL_TABLET | Freq: Two times a day (BID) | ORAL | 0 refills | Status: DC

## 2017-02-04 NOTE — Telephone Encounter (Signed)
[facility] current Metformin 1,000 mg script to #90

## 2017-02-04 NOTE — Telephone Encounter (Signed)
Changed rx to 90 days per pt request.

## 2017-03-13 ENCOUNTER — Other Ambulatory Visit: Payer: Self-pay

## 2017-03-13 ENCOUNTER — Telehealth: Payer: Self-pay

## 2017-03-13 MED ORDER — ATORVASTATIN CALCIUM 40 MG PO TABS: 40.0000 mg | ORAL_TABLET | Freq: Every day | ORAL | 0 refills | Status: DC

## 2017-03-13 MED ORDER — LOSARTAN POTASSIUM 50 MG PO TABS: 50.0000 mg | ORAL_TABLET | Freq: Every day | ORAL | 0 refills | Status: DC

## 2017-03-13 NOTE — Telephone Encounter (Signed)
Sent refill to pharmacy. 

## 2017-03-13 NOTE — Telephone Encounter (Signed)
[facility]ed [facility] [address] [facility]artan and atorvastatin. Thomas Ortega

## 2017-05-01 ENCOUNTER — Other Ambulatory Visit: Payer: Self-pay | Admitting: Medical

## 2017-06-17 ENCOUNTER — Telehealth: Payer: Self-pay | Admitting: Medical

## 2017-06-17 ENCOUNTER — Other Ambulatory Visit: Payer: Self-pay

## 2017-06-17 MED ORDER — ATORVASTATIN CALCIUM 40 MG PO TABS: 40.0000 mg | ORAL_TABLET | Freq: Every day | ORAL | 0 refills | Status: AC

## 2017-06-17 NOTE — Telephone Encounter (Signed)
Done KH 

## 2017-06-17 NOTE — Telephone Encounter (Signed)
New Message   *STAT* If patient is at the pharmacy, call can be transferred to refill team.   1. Which medications need to be refilled? (please list name of each medication and dose if known)  Atorvastatin 40 mg tablet once daily  2. Which pharmacy/location (including street and city if local pharmacy) is medication to be sent to? [facility] [phone], [address] [place], [place]  3. Do they need a 30 day or 90 day supply?  90 day supply  Not listed in patients med list

## 2017-07-30 ENCOUNTER — Other Ambulatory Visit: Payer: Self-pay | Admitting: Medical

## 2017-10-24 ENCOUNTER — Telehealth: Payer: Self-pay

## 2017-10-24 NOTE — Telephone Encounter (Signed)
Left message for patient to call back and schedule CPE.

## 2017-11-07 ENCOUNTER — Other Ambulatory Visit: Payer: Self-pay | Admitting: Medical

## 2017-11-07 NOTE — Telephone Encounter (Signed)
Send in refill once he makes physical appt

## 2017-11-07 NOTE — Telephone Encounter (Signed)
Pt is requesting a refill on this medication but has not called back to schedule his physical.

## 2017-11-17 ENCOUNTER — Other Ambulatory Visit: Payer: Self-pay | Admitting: Medical

## 2017-11-18 NOTE — Telephone Encounter (Signed)
Left message on voicemail for patient to call back to schedule appointment for CPE 

## 2017-11-30 ENCOUNTER — Other Ambulatory Visit: Payer: Self-pay | Admitting: Medical

## 2018-02-27 ENCOUNTER — Telehealth: Payer: Self-pay | Admitting: Medical

## 2018-02-27 NOTE — Telephone Encounter (Signed)
Dismissal letter in [employer]  °

## 2018-03-08 ENCOUNTER — Other Ambulatory Visit: Payer: Self-pay | Admitting: Medical

## 2018-05-15 ENCOUNTER — Ambulatory Visit
Admission: EM | Admit: 2018-05-15 | Discharge: 2018-05-15 | Disposition: A | Discharge status: Home / Self Care | Payer: 59 | Attending: Family Medicine | Admitting: Family Medicine

## 2018-05-15 ENCOUNTER — Encounter: Payer: Self-pay | Admitting: Emergency Medicine

## 2018-05-15 DIAGNOSIS — R0989 Other specified symptoms and signs involving the circulatory and respiratory systems: Secondary | ICD-10-CM | POA: Diagnosis not present

## 2018-05-15 DIAGNOSIS — R110 Nausea: Secondary | ICD-10-CM | POA: Diagnosis not present

## 2018-05-15 DIAGNOSIS — R509 Fever, unspecified: Secondary | ICD-10-CM | POA: Diagnosis not present

## 2018-05-15 DIAGNOSIS — R6889 Other general symptoms and signs: Secondary | ICD-10-CM

## 2018-05-15 DIAGNOSIS — R05 Cough: Secondary | ICD-10-CM

## 2018-05-15 MED ORDER — BENZONATATE 100 MG PO CAPS: 100.0000 mg | ORAL_CAPSULE | Freq: Three times a day (TID) | ORAL | 0 refills | Status: AC

## 2018-05-15 MED ORDER — ONDANSETRON HCL 4 MG PO TABS: 4.0000 mg | ORAL_TABLET | Freq: Four times a day (QID) | ORAL | 0 refills | Status: AC

## 2018-05-15 NOTE — ED Notes (Signed)
Patient able to ambulate independently  

## 2018-05-15 NOTE — ED Provider Notes (Addendum)
[place]   [id number] 05/15/18 Arrival Time: 1005   CC: flu like symptoms   SUBJECTIVE: History from: patient.  Thomas Ortega is a 58 y.o. male who presents with abrupt onset of congestion, cough, subjective fever, and nausea x 3 days.  Denies positive sick exposure or precipitating event.  Has tried theraflu without relief.  Reports previous symptoms in the past and diagnosed with cold.   Denies sinus pain, rhinorrhea, sore throat, SOB, wheezing, chest pain, changes in bowel or bladder habits.    Received flu shot this year: no.  ROS: As per HPI.  Past Medical History:  Diagnosis Date  . Diabetes mellitus   . Hepatitis B   . Hypertension   . Nausea    Past Surgical History:  Procedure Laterality Date  . MASS EXCISION  2009   base of skull   No Known Allergies No current facility-administered medications on file prior to encounter.    Current Outpatient Medications on File Prior to Encounter  Medication Sig Dispense Refill  . atorvastatin (LIPITOR) 40 MG tablet Take 1 tablet (40 mg total) by mouth daily. 90 tablet 0  . metFORMIN (GLUCOPHAGE) 1000 MG tablet TAKE 1 TABLET (1,000 MG TOTAL) BY MOUTH 2 (TWO) TIMES DAILY WITH A MEAL 180 tablet 0  . ACCU-CHEK FASTCLIX LANCETS MISC Use to check blood sugar 1-2 times daily.  Dx  E11.9 100 each 2  . clotrimazole-betamethasone (LOTRISONE) cream Apply 1 application topically 2 (two) times daily. 30 g 0  . cyclobenzaprine (FLEXERIL) 10 MG tablet Take 0.5-1 tablets (5-10 mg total) by mouth at bedtime. prn 15 tablet 0  . glucose blood (ACCU-CHEK SMARTVIEW) test strip Use as instructed to check blood sugar 1-2 times a day. 100 each 2  . losartan (COZAAR) 50 MG tablet TAKE 1 TABLET BY MOUTH EVERY DAY 90 tablet 0   Social History   Socioeconomic History  . Marital status: Married    Spouse name: Not on file  . Number of children: 1  . Years of education: Not on file  . Highest education level: Not on file  Occupational  History  . Occupation: unemployed  Social Needs  . Financial resource strain: Not on file  . Food insecurity:    Worry: Not on file    Inability: Not on file  . Transportation needs:    Medical: Not on file    Non-medical: Not on file  Tobacco Use  . Smoking status: Never Smoker  . Smokeless tobacco: Never Used  Substance and Sexual Activity  . Alcohol use: No  . Drug use: No  . Sexual activity: Not on file  Lifestyle  . Physical activity:    Days per week: Not on file    Minutes per session: Not on file  . Stress: Not on file  Relationships  . Social connections:    Talks on phone: Not on file    Gets together: Not on file    Attends religious service: Not on file    Active member of club or organization: Not on file    Attends meetings of clubs or organizations: Not on file    Relationship status: Not on file  . Intimate partner violence:    Fear of current or ex partner: Not on file    Emotionally abused: Not on file    Physically abused: Not on file    Forced sexual activity: Not on file  Other Topics Concern  . Not on file  Social  History Narrative   Originally from [place]   Currently unemployed   Has 2 grown children who still live in [place]   Family History  Problem Relation Age of Onset  . Prostate cancer Neg Hx   . Colon cancer Neg Hx   . Breast cancer Neg Hx   . Heart disease Neg Hx   . Diabetes Neg Hx     OBJECTIVE:  Vitals:   05/15/18 1020  BP: (!) 166/93  Pulse: 98  Resp: 18  Temp: 99.3 F (37.4 C)  TempSrc: Oral  SpO2: 95%     General appearance: alert; appears fatigued, but nontoxic; speaking in full sentences and tolerating own secretions HEENT: NCAT; Ears: EACs clear, TMs pearly gray; Eyes: PERRL.  EOM grossly intact.  Nose: nares patent without rhinorrhea, turbinates swollen and erythematous, Throat: oropharynx clear, tonsils non erythematous or enlarged, uvula midline  Neck: supple without LAD Lungs: unlabored respirations,  symmetrical air entry; cough: mild; no respiratory distress; CTAB Heart: regular rate and rhythm.  Radial pulses 2+ symmetrical bilaterally Skin: warm and dry Psychological: alert and cooperative; normal mood and affect  ASSESSMENT & PLAN:  1. Flu-like symptoms     Meds ordered this encounter  Medications  . ondansetron (ZOFRAN) 4 MG tablet    Sig: Take 1 tablet (4 mg total) by mouth every 6 (six) hours.    Dispense:  12 tablet    Refill:  0    Order Specific Question:   Supervising Provider    Answer:   Raylene Everts [[id number]]  . benzonatate (TESSALON) 100 MG capsule    Sig: Take 1 capsule (100 mg total) by mouth every 8 (eight) hours.    Dispense:  21 capsule    Refill:  0    Order Specific Question:   Supervising Provider    Answer:   Raylene Everts [[id number]]    Get plenty of rest and push fluids Tessalon Perles prescribed for cough Zofran prescribed.  Take as needed for nausea Use medications daily for symptom relief Use OTC medications like ibuprofen or tylenol as needed fever or pain Follow up with PCP or with [facility] if symptoms persist Return or go to ER if you have any new or worsening symptoms fever, chills, nausea, vomiting, chest pain, cough, shortness of breath, wheezing, abdominal pain, changes in bowel or bladder habits, etc...  Reviewed expectations re: course of current medical issues. Questions answered. Outlined signs and symptoms indicating need for more acute intervention. Patient verbalized understanding. After Visit Summary given.         Lestine Box, PA-C 05/15/18 [place], [place], [place] 05/15/18 1202

## 2018-05-15 NOTE — ED Triage Notes (Signed)
Pt presents to [facility] for assessment of cough congestion nausea and body aches x 3 days.  C/o severe 8/10 chest pain when he coughs, substernal.

## 2018-05-15 NOTE — Discharge Instructions (Signed)
Get plenty of rest and push fluids Tessalon Perles prescribed for cough Zofran prescribed.  Take as needed for nausea Use medications daily for symptom relief Use OTC medications like ibuprofen or tylenol as needed fever or pain Follow up with PCP or with [facility] if symptoms persist Return or go to ER if you have any new or worsening symptoms fever, chills, nausea, vomiting, chest pain, cough, shortness of breath, wheezing, abdominal pain, changes in bowel or bladder habits, etc..Marland Kitchen

## 2018-05-18 ENCOUNTER — Other Ambulatory Visit: Payer: Self-pay

## 2018-05-18 ENCOUNTER — Encounter (HOSPITAL_COMMUNITY): Payer: Self-pay | Admitting: Emergency Medicine

## 2018-05-18 ENCOUNTER — Emergency Department (HOSPITAL_COMMUNITY): Payer: 59

## 2018-05-18 ENCOUNTER — Emergency Department (HOSPITAL_COMMUNITY)
Admission: EM | Admit: 2018-05-18 | Discharge: 2018-05-18 | Disposition: A | Discharge status: Home / Self Care | Payer: 59 | Attending: Emergency Medicine | Admitting: Emergency Medicine

## 2018-05-18 DIAGNOSIS — Z7984 Long term (current) use of oral hypoglycemic drugs: Secondary | ICD-10-CM | POA: Diagnosis not present

## 2018-05-18 DIAGNOSIS — R6889 Other general symptoms and signs: Secondary | ICD-10-CM

## 2018-05-18 DIAGNOSIS — J111 Influenza due to unidentified influenza virus with other respiratory manifestations: Secondary | ICD-10-CM | POA: Insufficient documentation

## 2018-05-18 DIAGNOSIS — E119 Type 2 diabetes mellitus without complications: Secondary | ICD-10-CM | POA: Diagnosis not present

## 2018-05-18 DIAGNOSIS — Z79899 Other long term (current) drug therapy: Secondary | ICD-10-CM | POA: Insufficient documentation

## 2018-05-18 DIAGNOSIS — M940 Chondrocostal junction syndrome [Tietze]: Secondary | ICD-10-CM | POA: Diagnosis not present

## 2018-05-18 DIAGNOSIS — R059 Cough, unspecified: Secondary | ICD-10-CM

## 2018-05-18 DIAGNOSIS — R05 Cough: Secondary | ICD-10-CM

## 2018-05-18 DIAGNOSIS — I10 Essential (primary) hypertension: Secondary | ICD-10-CM | POA: Diagnosis not present

## 2018-05-18 DIAGNOSIS — R110 Nausea: Secondary | ICD-10-CM

## 2018-05-18 LAB — BASIC METABOLIC PANEL
Anion gap: 8 (ref 5–15)
BUN: 15 mg/dL (ref 6–20)
CO2: 29 mmol/L (ref 22–32)
Calcium: 9.3 mg/dL (ref 8.9–10.3)
Chloride: 101 mmol/L (ref 98–111)
Creatinine, Ser: 1.23 mg/dL (ref 0.61–1.24)
GFR calc Af Amer: >60 mL/min (ref >60)
GLUCOSE: 100 mg/dL — AB (ref 70–99)
Potassium: 3.6 mmol/L (ref 3.5–5.1)
Sodium: 138 mmol/L (ref 135–145)

## 2018-05-18 LAB — CBC WITH DIFFERENTIAL/PLATELET
Abs Immature Granulocytes: 0.02 10*3/uL (ref 0.00–0.07)
Basophils Absolute: 0 10*3/uL (ref 0.0–0.1)
Basophils Relative: 0 %
EOS ABS: 0.3 10*3/uL (ref 0.0–0.5)
Eosinophils Relative: 5 %
HEMATOCRIT: 44.1 % (ref 39.0–52.0)
Hemoglobin: 15 g/dL (ref 13.0–17.0)
Immature Granulocytes: 0 %
Lymphocytes Relative: 37 %
Lymphs Abs: 2.2 10*3/uL (ref 0.7–4.0)
MCH: 27.7 pg (ref 26.0–34.0)
MCHC: 34 g/dL (ref 30.0–36.0)
MCV: 81.4 fL (ref 80.0–100.0)
Monocytes Absolute: 0.5 10*3/uL (ref 0.1–1.0)
Monocytes Relative: 9 %
Neutro Abs: 2.8 10*3/uL (ref 1.7–7.7)
Neutrophils Relative %: 49 %
PLATELETS: 133 10*3/uL — AB (ref 150–400)
RBC: 5.42 MIL/uL (ref 4.22–5.81)
RDW: 14.3 % (ref 11.5–15.5)
WBC: 5.8 10*3/uL (ref 4.0–10.5)
nRBC: 0 % (ref 0.0–0.2)

## 2018-05-18 LAB — I-STAT TROPONIN, ED: Troponin i, poc: 0 ng/mL (ref 0.00–0.08)

## 2018-05-18 MED ORDER — SODIUM CHLORIDE 0.9 % IV BOLUS
1000.0000 mL | Freq: Once | INTRAVENOUS | Status: AC
  Administered 2018-05-18: 1000 mL via INTRAVENOUS

## 2018-05-18 MED ORDER — MORPHINE SULFATE (PF) 4 MG/ML IV SOLN
4.0000 mg | Freq: Once | INTRAVENOUS | Status: AC
  Administered 2018-05-18: 4 mg via INTRAVENOUS
  Filled 2018-05-18: qty 1

## 2018-05-18 MED ORDER — IPRATROPIUM BROMIDE 0.02 % IN SOLN
0.5000 mg | Freq: Once | RESPIRATORY_TRACT | Status: AC
  Administered 2018-05-18: 0.5 mg via RESPIRATORY_TRACT
  Filled 2018-05-18: qty 2.5

## 2018-05-18 MED ORDER — ONDANSETRON HCL 4 MG/2ML IJ SOLN
4.0000 mg | Freq: Once | INTRAMUSCULAR | Status: AC
  Administered 2018-05-18: 4 mg via INTRAVENOUS
  Filled 2018-05-18: qty 2

## 2018-05-18 MED ORDER — ALBUTEROL SULFATE (2.5 MG/3ML) 0.083% IN NEBU
5.0000 mg | INHALATION_SOLUTION | Freq: Once | RESPIRATORY_TRACT | Status: AC
  Administered 2018-05-18: 5 mg via RESPIRATORY_TRACT
  Filled 2018-05-18: qty 6

## 2018-05-18 MED ORDER — ALBUTEROL SULFATE HFA 108 (90 BASE) MCG/ACT IN AERS: 1.0000 | INHALATION_SPRAY | RESPIRATORY_TRACT | 0 refills | Status: AC | PRN

## 2018-05-18 MED ORDER — ALBUTEROL SULFATE HFA 108 (90 BASE) MCG/ACT IN AERS
2.0000 | INHALATION_SPRAY | Freq: Once | RESPIRATORY_TRACT | Status: AC
  Administered 2018-05-18: 2 via RESPIRATORY_TRACT
  Filled 2018-05-18: qty 6.7

## 2018-05-18 NOTE — ED Provider Notes (Signed)
[facility] DEPT Provider Note   CSN: [id number] Arrival date & time: 05/18/18  1128    History   Chief Complaint Chief Complaint  Patient presents with  . Cough  . Headache    HPI    Thomas Ortega is a 58 y.o. male with a PMHx of DM2, HTN, Hep B, HLD, GERD, and other conditions listed below, who presents to the ED with complaints of abrupt onset constant moderate flu like symptoms x4 days.  Patient states that he started having flulike symptoms 4 days ago.  Chart review reveals he was seen at [facility] on 05/15/18 (3 days ago) for same complaint, no testing/imaging was done, he was discharged with tesslon and zofran rx's.  He states he has been taking these medications without relief.  His symptoms include cough with brownish sputum production, headaches, body aches, congestion, nausea, and chest pain only with coughing or palpation of the chest.  No known aggravating factors for his symptoms.  He has not taken anything else other than the Tessalon and Zofran.  He denies recent travel, sick contacts, or taking the flu shot this year.  He is a non-smoker.  He denies any fevers, chills, sore throat, rhinorrhea, shortness of breath, abdominal pain, vomiting, diarrhea, constipation, dysuria, hematuria, numbness, tingling, focal weakness, or any other complaints at this time.  The history is provided by the patient and medical records. No language interpreter was used.  Cough  Associated symptoms: chest pain (only with coughing or palpation), headaches and myalgias   Associated symptoms: no chills, no fever, no rhinorrhea, no shortness of breath and no sore throat   Headache  Associated symptoms: congestion, cough, myalgias and nausea   Associated symptoms: no abdominal pain, no diarrhea, no fever, no numbness, no sore throat, no vomiting and no weakness     Past Medical History:  Diagnosis Date  . Diabetes mellitus   . Hepatitis B   . Hypertension   . Nausea      Patient Active Problem List   Diagnosis Date Noted  . Tinea cruris 12/17/2016  . Umbilical hernia without obstruction and without gangrene 12/17/2016  . Daytime somnolence 01/25/2015  . Testicular pain, left 09/06/2014  . Right-sided low back pain with right-sided sciatica 09/06/2014  . Palpitations 07/07/2014  . Onychomycosis 07/07/2014  . Neck pain on right side 10/14/2013  . Abnormal liver function test 06/20/2011  . Back pain 06/20/2011  . HTN (hypertension) 06/20/2011  . Hyperlipidemia 06/20/2011  . DM type 2 with diabetic mixed hyperlipidemia ([place]) 06/20/2011  . GERD (gastroesophageal reflux disease) 06/20/2011    Past Surgical History:  Procedure Laterality Date  . MASS EXCISION  2009   base of skull        Home Medications    Prior to Admission medications   Medication Sig Start Date End Date Taking? Authorizing Provider  ACCU-CHEK FASTCLIX LANCETS MISC Use to check blood sugar 1-2 times daily.  Dx  E11.9 01/25/15   Debbrah Alar, NP  atorvastatin (LIPITOR) 40 MG tablet Take 1 tablet (40 mg total) by mouth daily. 06/17/17   Tysinger, Camelia Eng, PA-C  benzonatate (TESSALON) 100 MG capsule Take 1 capsule (100 mg total) by mouth every 8 (eight) hours. 05/15/18   Wurst, [place], PA-C  clotrimazole-betamethasone (LOTRISONE) cream Apply 1 application topically 2 (two) times daily. 01/21/17   Tysinger, Camelia Eng, PA-C  cyclobenzaprine (FLEXERIL) 10 MG tablet Take 0.5-1 tablets (5-10 mg total) by mouth at bedtime. prn 12/18/16  Tysinger, Camelia Eng, PA-C  glucose blood (ACCU-CHEK SMARTVIEW) test strip Use as instructed to check blood sugar 1-2 times a day. 01/25/15   Debbrah Alar, NP  losartan (COZAAR) 50 MG tablet TAKE 1 TABLET BY MOUTH EVERY DAY 11/07/17   Tysinger, Camelia Eng, PA-C  metFORMIN (GLUCOPHAGE) 1000 MG tablet TAKE 1 TABLET (1,000 MG TOTAL) BY MOUTH 2 (TWO) TIMES DAILY WITH A MEAL 07/30/17   Tysinger, Camelia Eng, PA-C  ondansetron (ZOFRAN) 4 MG tablet Take 1  tablet (4 mg total) by mouth every 6 (six) hours. 05/15/18   Lestine Box, PA-C    Family History Family History  Problem Relation Age of Onset  . Prostate cancer Neg Hx   . Colon cancer Neg Hx   . Breast cancer Neg Hx   . Heart disease Neg Hx   . Diabetes Neg Hx     Social History Social History   Tobacco Use  . Smoking status: Never Smoker  . Smokeless tobacco: Never Used  Substance Use Topics  . Alcohol use: No  . Drug use: No     Allergies   Patient has no known allergies.   Review of Systems Review of Systems  Constitutional: Negative for chills and fever.  HENT: Positive for congestion. Negative for rhinorrhea and sore throat.   Respiratory: Positive for cough. Negative for shortness of breath.   Cardiovascular: Positive for chest pain (only with coughing or palpation).  Gastrointestinal: Positive for nausea. Negative for abdominal pain, constipation, diarrhea and vomiting.  Genitourinary: Negative for dysuria and hematuria.  Musculoskeletal: Positive for myalgias. Negative for arthralgias.  Skin: Negative for color change.  Allergic/Immunologic: Positive for immunocompromised state (DM2).  Neurological: Positive for headaches. Negative for weakness and numbness.  Psychiatric/Behavioral: Negative for confusion.   All other systems reviewed and are negative for acute change except as noted in the HPI.    Physical Exam Updated Vital Signs BP (!) 112/96 (BP Location: Left Arm)   Pulse 90   Temp 98.6 F (37 C) (Oral)   Resp 18   SpO2 98%   Physical Exam Vitals signs and nursing note reviewed.  Constitutional:      General: He is not in acute distress.    Appearance: Normal appearance. He is well-developed. He is not toxic-appearing.     Comments: Afebrile, nontoxic, NAD  HENT:     Head: Normocephalic and atraumatic.     Nose: Congestion present.  Eyes:     General:        Right eye: No discharge.        Left eye: No discharge.      Conjunctiva/sclera: Conjunctivae normal.  Neck:     Musculoskeletal: Normal range of motion and neck supple.     Comments: No meningismus Cardiovascular:     Rate and Rhythm: Normal rate and regular rhythm.     Pulses: Normal pulses.     Heart sounds: Normal heart sounds, S1 normal and S2 normal. No murmur. No friction rub. No gallop.   Pulmonary:     Effort: Pulmonary effort is normal. No respiratory distress.     Breath sounds: Wheezing and rhonchi present. No decreased breath sounds or rales.     Comments: Scattered rhonchi throughout with harsh cough during exam, faint expiratory wheezing in lower fields, no rales, no hypoxia or increased WOB, speaking in full sentences, SpO2 100% on RA  Chest:     Chest wall: Tenderness present. No deformity or crepitus.  Comments: Mild central chest wall TTP, no crepitus or deformities, no retractions Abdominal:     General: Bowel sounds are normal. There is no distension.     Palpations: Abdomen is soft. Abdomen is not rigid.     Tenderness: There is no abdominal tenderness. There is no right CVA tenderness, left CVA tenderness, guarding or rebound. Negative signs include Murphy's sign and McBurney's sign.  Musculoskeletal: Normal range of motion.  Skin:    General: Skin is warm and dry.     Findings: No rash.  Neurological:     Mental Status: He is alert and oriented to person, place, and time.     Sensory: Sensation is intact. No sensory deficit.     Motor: Motor function is intact.  Psychiatric:        Mood and Affect: Mood and affect normal.        Behavior: Behavior normal.      ED Treatments / Results  Labs (all labs ordered are listed, but only abnormal results are displayed) Labs Reviewed  CBC WITH DIFFERENTIAL/PLATELET - Abnormal; Notable for the following components:      Result Value   Platelets 133 (*)    All other components within normal limits  BASIC METABOLIC PANEL - Abnormal; Notable for the following  components:   Glucose, Bld 100 (*)    All other components within normal limits  I-STAT TROPONIN, ED    EKG EKG Interpretation  Date/Time:  "Sunday May 18 2018 14:03:01 EST Ventricular Rate:  82 PR Interval:    QRS Duration: 88 QT Interval:  388 QTC Calculation: 454 R Axis:   13 Text Interpretation:  Sinus rhythm RSR' in V1 or V2, probably normal variant Borderline ECG Confirmed by Lockwood, Robert ([id number]) on 05/18/2018 2:47:21 PM   Radiology Dg Chest 2 View  Result Date: 05/18/2018 CLINICAL DATA:  Productive cough. EXAM: CHEST - 2 VIEW COMPARISON:  02/27/2013 FINDINGS: Cardiomediastinal silhouette is normal. Mediastinal contours appear intact. There is no evidence of focal airspace consolidation, pleural effusion or pneumothorax. Osseous structures are without acute abnormality. Soft tissues are grossly normal. IMPRESSION: No active cardiopulmonary disease. Electronically Signed   By: Dobrinka  Dimitrova M.D.   On: 05/18/2018 14:23    Procedures Procedures (including critical care time)  Medications Ordered in ED Medications  morphine 4 MG/ML injection 4 mg (4 mg Intravenous Given 05/18/18 1412)  ondansetron (ZOFRAN) injection 4 mg (4 mg Intravenous Given 05/18/18 1412)  sodium chloride 0.9 % bolus 1,000 mL (0 mLs Intravenous Stopped 05/18/18 1512)  albuterol (PROVENTIL) (2.5 MG/3ML) 0.083% nebulizer solution 5 mg (5 mg Nebulization Given 05/18/18 1413)  ipratropium (ATROVENT) nebulizer solution 0.5 mg (0.5 mg Nebulization Given 05/18/18 1413)  albuterol (PROVENTIL HFA;VENTOLIN HFA) 108 (90 Base) MCG/ACT inhaler 2 puff (2 puffs Inhalation Given 05/18/18 1622)     Initial Impression / Assessment and Plan / ED Course  I have reviewed the triage vital signs and the nursing notes.  Pertinent labs & imaging results that were available during my care of the patient were reviewed by me and considered in my medical decision making (see chart for details).        57"  y.o. male here  with flulike symptoms that began 4 days ago.  On exam, scattered rhonchi in all lung fields with harsh cough during exam, faint wheezing in lower lung fields, afebrile and nontoxic-appearing, mild chest wall tenderness anteriorly, mild nasal congestion.  No meningismus or focal neuro deficits.  Will get chest x-ray,  labs, doubt need for flu testing given that he is outside of the window for Tamiflu, will give pain and nausea medicines, fluids, duonebs, and reassess shortly.  4:36 PM CBC w/diff with mildly low plt 133 similar to priors. BMP unremarkable. Trop neg. EKG nonischemic. CXR negative. Pt feeling better and lung sounds improved after nebs. Overall, suspect viral etiology such as flu or other viral URI, and costochondritis from coughing. Reassuring work up today. Doubt need for further emergent work up at this time. Outside of tamiflu window. Will send home with inhaler, advised continuation of rx's given at [facility] visit, and other OTC remedies for symptomatic relief. F/up with PCP in 1wk for recheck. I explained the diagnosis and have given explicit precautions to return to the ER including for any other new or worsening symptoms. The patient understands and accepts the medical plan as it's been dictated and I have answered their questions. Discharge instructions concerning home care and prescriptions have been given. The patient is STABLE and is discharged to home in good condition.    Final Clinical Impressions(s) / ED Diagnoses   Final diagnoses:  Flu-like symptoms  Cough  Nausea  Costochondritis    ED Discharge Orders         Ordered    albuterol (PROVENTIL HFA;VENTOLIN HFA) 108 (90 Base) MCG/ACT inhaler  Every 4 hours PRN     05/18/18 [address] [place], [place] 05/18/18 1636    Carmin Muskrat, MD 05/22/18 2152

## 2018-05-18 NOTE — Discharge Instructions (Signed)
Continue to stay well-hydrated. Continue to use home zofran as directed as needed for nausea. Continue to alternate between Tylenol and Ibuprofen for pain or fever. Use Mucinex/Robitussin/etc for cough suppression/expectoration of mucus. Use over the counter flonase and the netipot to help with nasal congestion. May consider over-the-counter Benadryl or other antihistamine like Claritin/Zyrtec/etc to decrease secretions and for help with your symptoms. Use inhaler as directed, as needed for cough/chest congestion/wheezing/shortness of breath. Use home Tesslon pearls for cough suppression. Follow up with your primary care doctor in 5-7 days for recheck of ongoing symptoms. Return to emergency department for emergent changing or worsening of symptoms.

## 2018-05-18 NOTE — ED Triage Notes (Signed)
Pt c/o cough, headache and neck pain since Tuesday. Reports went pt UC couple days ago and was given medications that arent helping. Reports cough is productive sometimes.

## 2019-04-01 ENCOUNTER — Other Ambulatory Visit: Payer: Self-pay | Admitting: Otolaryngology

## 2019-04-01 DIAGNOSIS — E079 Disorder of thyroid, unspecified: Secondary | ICD-10-CM

## 2019-09-02 ENCOUNTER — Other Ambulatory Visit: Payer: Self-pay | Admitting: Otolaryngology

## 2019-09-02 DIAGNOSIS — E079 Disorder of thyroid, unspecified: Secondary | ICD-10-CM

## 2020-02-11 ENCOUNTER — Ambulatory Visit
Admission: RE | Admit: 2020-02-11 | Discharge: 2020-02-11 | Disposition: A | Discharge status: Home / Self Care | Payer: 59 | Source: Ambulatory Visit | Attending: Otolaryngology | Admitting: Otolaryngology

## 2020-02-11 DIAGNOSIS — E079 Disorder of thyroid, unspecified: Secondary | ICD-10-CM

## 2020-02-16 ENCOUNTER — Other Ambulatory Visit: Payer: Self-pay | Admitting: Otolaryngology

## 2020-02-16 DIAGNOSIS — E079 Disorder of thyroid, unspecified: Secondary | ICD-10-CM

## 2020-03-01 ENCOUNTER — Ambulatory Visit
Admission: RE | Admit: 2020-03-01 | Discharge: 2020-03-01 | Disposition: A | Discharge status: Home / Self Care | Payer: BLUE CROSS/BLUE SHIELD | Source: Ambulatory Visit | Attending: Otolaryngology | Admitting: Otolaryngology

## 2020-03-01 ENCOUNTER — Other Ambulatory Visit (HOSPITAL_COMMUNITY)
Admission: RE | Admit: 2020-03-01 | Discharge: 2020-03-01 | Disposition: A | Discharge status: Home / Self Care | Payer: BC Managed Care – PPO | Source: Ambulatory Visit | Attending: Radiology | Admitting: Radiology

## 2020-03-01 DIAGNOSIS — E079 Disorder of thyroid, unspecified: Secondary | ICD-10-CM

## 2020-03-01 DIAGNOSIS — D34 Benign neoplasm of thyroid gland: Secondary | ICD-10-CM | POA: Diagnosis not present

## 2020-03-01 DIAGNOSIS — E042 Nontoxic multinodular goiter: Secondary | ICD-10-CM | POA: Diagnosis present

## 2020-03-02 LAB — CYTOLOGY - NON PAP

## 2021-07-08 IMAGING — US US THYROID
1 series · 12 of 25 positions shown · non-contrast
Comparison: 01/10/2009

CLINICAL DATA: Palpable abnormality.

EXAM:
THYROID ULTRASOUND
TECHNIQUE: Ultrasound examination of the thyroid gland and adjacent soft
tissues was performed.

[Series 1: us thyroid · 0.08mm/px · 12 of 57 slices shown]
[im 3/57]
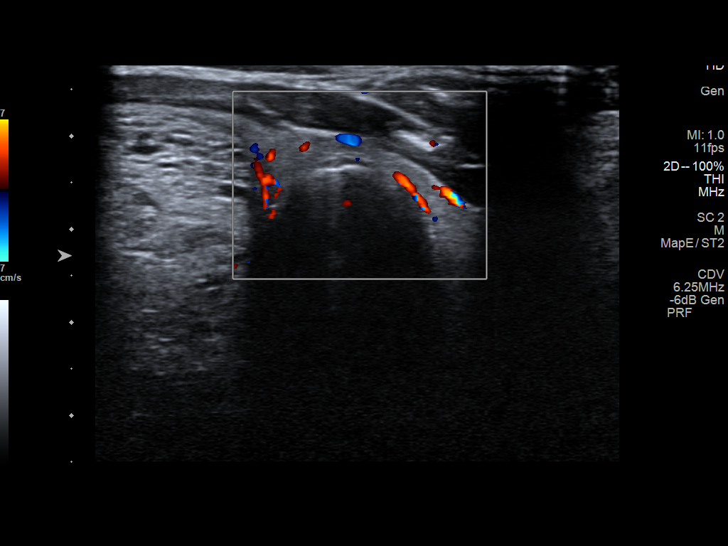
[im 8/57]
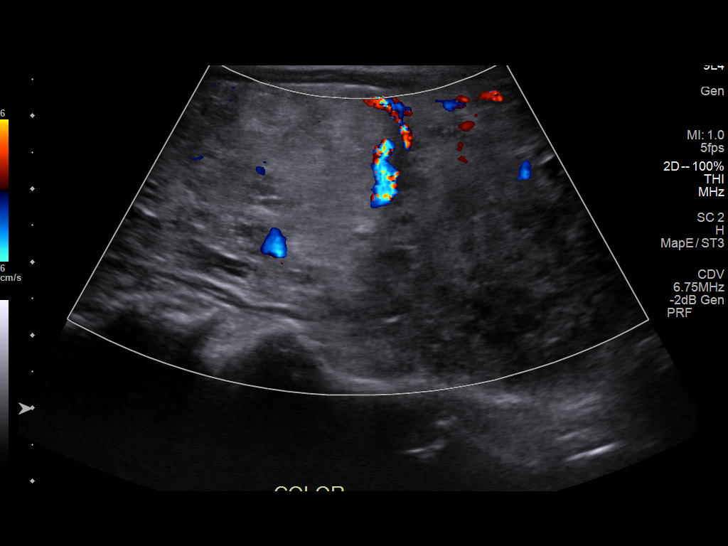
[im 12/57]
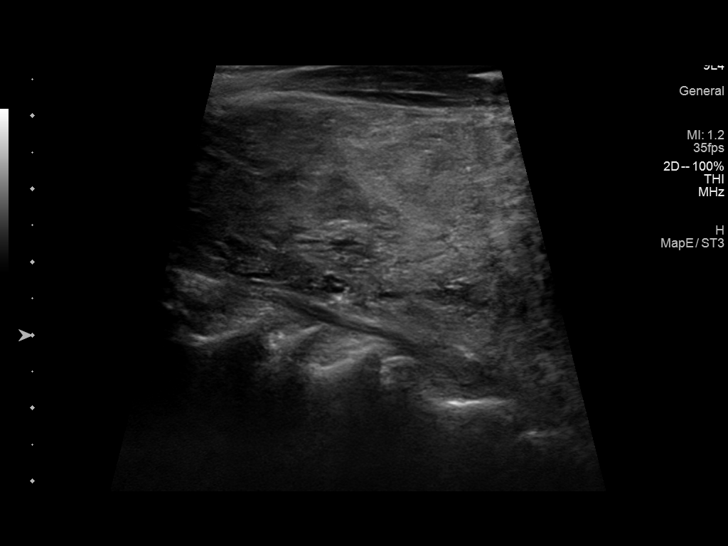
[im 17/57]
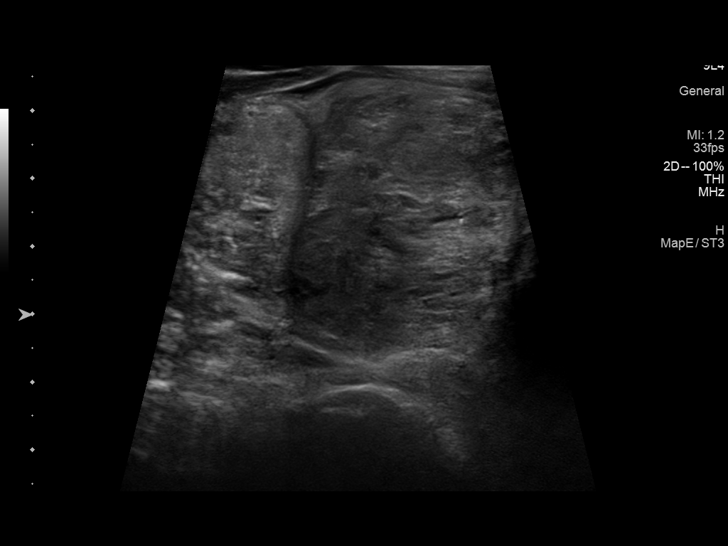
[im 22/57]
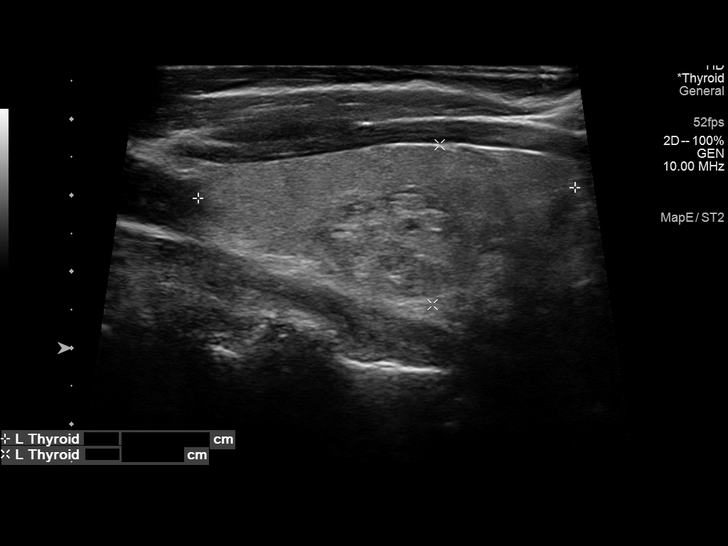
[im 26/57]
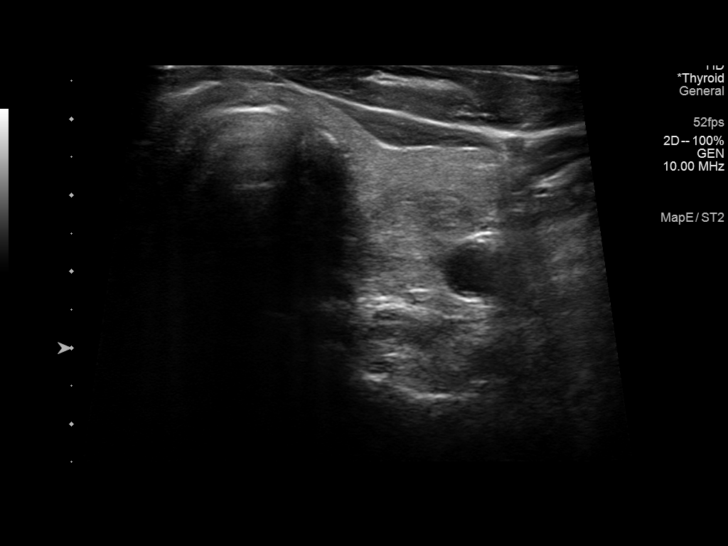
[im 31/57]
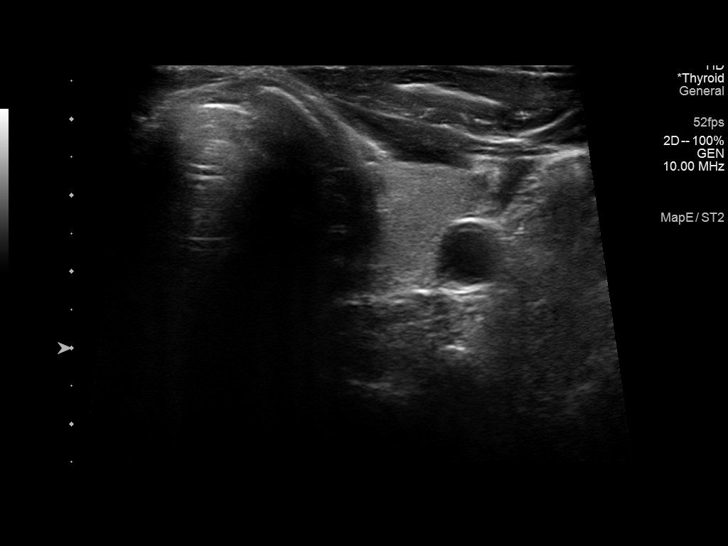
[im 36/57]
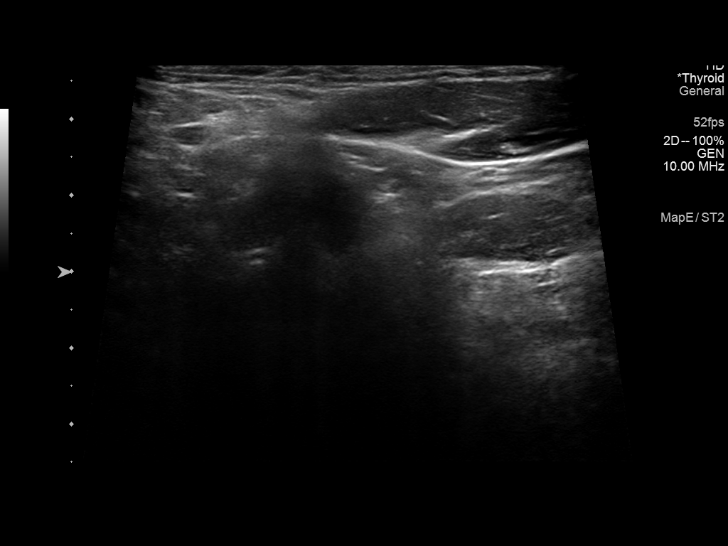
[im 40/57]
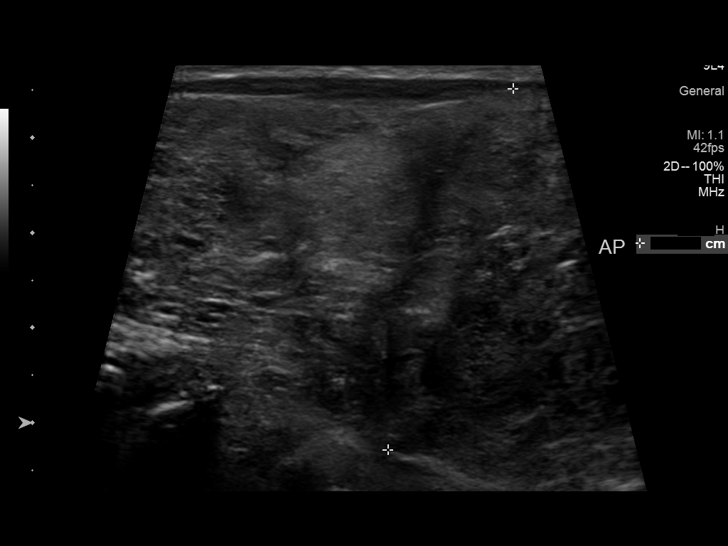
[im 45/57]
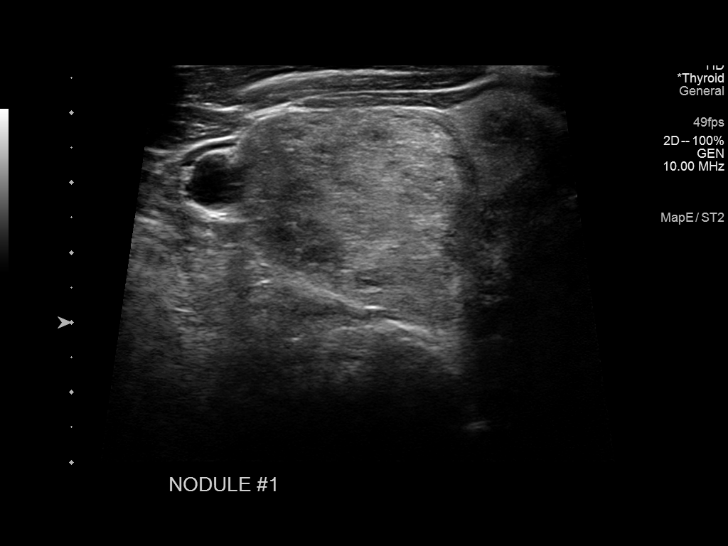
[im 50/57]
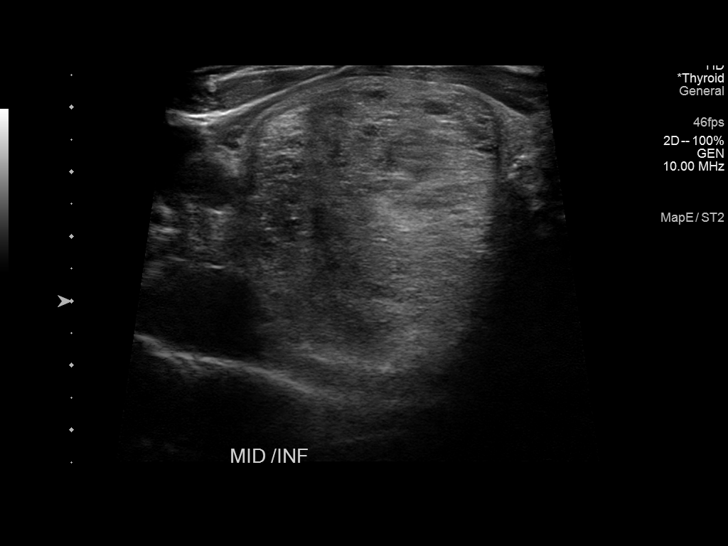
[im 54/57]
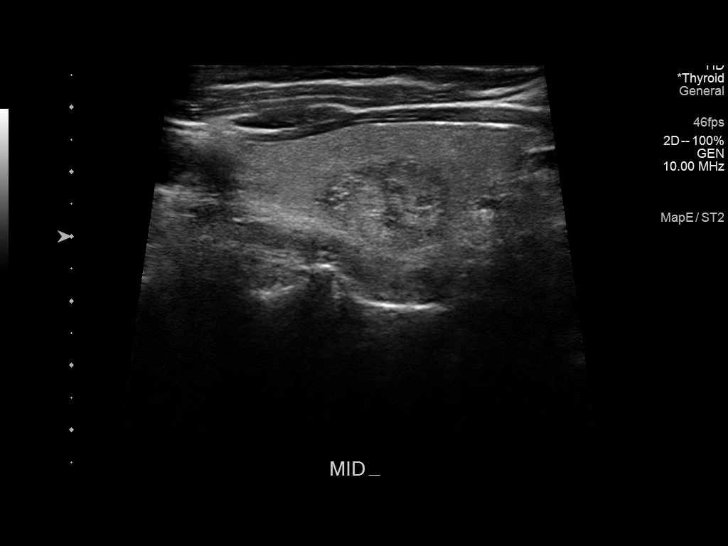

[12 of 25 positions shown; findings below may reference images not displayed]

FINDINGS: Parenchymal Echotexture: Normal

Isthmus: 0.2 cm, previously 0.2 cm

Right lobe: 8.2 x 4.3 x 4.5 cm, previously 5.8 x 3.0 x 2.6 cm

Left lobe: 4.9 x 2.1 x 2.0 cm, previously 5.3 x 1.5 x 1.8 cm

_________________________________________________________

Estimated total number of nodules >/= 1 cm: 3

Number of spongiform nodules >/=  2 cm not described below (TR1): 0

Number of mixed cystic and solid nodules >/= 1.5 cm not described
below (TR2): 0

_________________________________________________________

Nodule # 1:

Prior biopsy: No

Location: Right; Superior

Maximum size: 5.1 cm; Other 2 dimensions: 3.4 x 2.6 cm, previously,
2.8 x 2.0 x 1.9 cm

Composition: solid/almost completely solid (2)

Echogenicity: isoechoic (1)

Shape: not taller-than-wide (0)

Margins: smooth (0)

Echogenic foci: none (0)

ACR TI-RADS total points: 3.

ACR TI-RADS risk category:  TR3 (3 points).

Significant change in size (>/= 20% in two dimensions and minimal
increase of 2 mm): Yes

Change in features: No

Change in ACR TI-RADS risk category: No

ACR TI-RADS recommendations:

**Given size (>/= 2.5 cm) and appearance, fine needle aspiration of
this mildly suspicious nodule should be considered based on TI-RADS
criteria.

_________________________________________________________

Nodule # 2:

Prior biopsy: No

Location: Right; Mid

Maximum size: 4.1 cm; Other 2 dimensions: 4.0 x 3.9 cm, previously,
2.2 x 2.0 x 1.9 cm

Composition: solid/almost completely solid (2)

Echogenicity: isoechoic (1)

Shape: not taller-than-wide (0)

Margins: smooth (0)

Echogenic foci: none (0)

ACR TI-RADS total points: 3.

ACR TI-RADS risk category:  TR3 (3 points).

Significant change in size (>/= 20% in two dimensions and minimal
increase of 2 mm): Yes

Change in features: No

Change in ACR TI-RADS risk category: No

ACR TI-RADS recommendations:

**Given size (>/= 2.5 cm) and appearance, fine needle aspiration of
this mildly suspicious nodule should be considered based on TI-RADS
criteria.

_________________________________________________________

Nodule # 3:

Prior biopsy: No

Location: Left; Mid

Maximum size: 2.0 cm; Other 2 dimensions: 1.3 x 1.3 cm, previously,
1.1 x 0.9 x 0.9 cm

Composition: solid/almost completely solid (2)

Echogenicity: isoechoic (1)

Shape: not taller-than-wide (0)

Margins: smooth (0)

Echogenic foci: none (0)

ACR TI-RADS total points: 3.

ACR TI-RADS risk category:  TR3 (3 points).

Significant change in size (>/= 20% in two dimensions and minimal
increase of 2 mm): Yes

Change in features: No

Change in ACR TI-RADS risk category: No

ACR TI-RADS recommendations:

*Given size (>/= 1.5 - 2.4 cm) and appearance, a follow-up
ultrasound in 1 year should be considered based on TI-RADS criteria.

_________________________________________________________
IMPRESSION: 1. Multinodular goiter.
2. Interval enlargement of previously visualized right thyroid
nodules. The more superior nodule (labeled 1) now measures up to
cm, previously 2.8 cm. The more inferior nodule (labeled 2) now
measures up to 4.1 cm, previously 2.2 cm. Each of these nodules meet
criteria (TI-RADS category 3) for tissue sampling. Recommend
ultrasound-guided fine-needle aspiration of each nodule.
3. Enlargement of previous visualized left solid thyroid nodule, now
measuring up to 2.0 cm, previously 1.1 cm. This nodule meets
criteria (TI-RADS category 3) for 1 year ultrasound follow-up.

The above is in keeping with the ACR TI-RADS recommendations - [HOSPITAL] 3248;[DATE].

## 2021-08-11 ENCOUNTER — Encounter: Payer: Self-pay | Admitting: Gastroenterology

## 2023-01-02 ENCOUNTER — Emergency Department (HOSPITAL_COMMUNITY)
Admission: EM | Admit: 2023-01-02 | Discharge: 2023-01-02 | Disposition: A | Discharge status: Home / Self Care | Payer: BC Managed Care – PPO | Attending: Emergency Medicine | Admitting: Emergency Medicine

## 2023-01-02 ENCOUNTER — Encounter (HOSPITAL_COMMUNITY): Payer: Self-pay | Admitting: Emergency Medicine

## 2023-01-02 ENCOUNTER — Other Ambulatory Visit: Payer: Self-pay

## 2023-01-02 DIAGNOSIS — I10 Essential (primary) hypertension: Secondary | ICD-10-CM | POA: Diagnosis not present

## 2023-01-02 DIAGNOSIS — Z7984 Long term (current) use of oral hypoglycemic drugs: Secondary | ICD-10-CM | POA: Diagnosis not present

## 2023-01-02 DIAGNOSIS — R040 Epistaxis: Secondary | ICD-10-CM | POA: Diagnosis present

## 2023-01-02 DIAGNOSIS — Z79899 Other long term (current) drug therapy: Secondary | ICD-10-CM | POA: Diagnosis not present

## 2023-01-02 DIAGNOSIS — E119 Type 2 diabetes mellitus without complications: Secondary | ICD-10-CM | POA: Insufficient documentation

## 2023-01-02 MED ORDER — ACETAMINOPHEN 325 MG PO TABS
650.0000 mg | ORAL_TABLET | Freq: Once | ORAL | Status: AC
  Administered 2023-01-02: 650 mg via ORAL
  Filled 2023-01-02: qty 2

## 2023-01-02 MED ORDER — OXYMETAZOLINE HCL 0.05 % NA SOLN
1.0000 | Freq: Once | NASAL | Status: AC
  Administered 2023-01-02: 1 via NASAL
  Filled 2023-01-02: qty 30

## 2023-01-02 NOTE — ED Triage Notes (Signed)
Pt presents for HA and nosebleed after getting home from work tonight.   States has been bleeding for 30 min. In triage, bleeding appears nearly controlled (scant drainage from nose that patient wipes away periodically) .  Takes amlodipine and lisinopril for HTN.

## 2023-01-02 NOTE — ED Provider Triage Note (Signed)
Emergency Medicine Provider Triage Evaluation Note  Thomas Ortega , a 62 y.o. male  was evaluated in triage.  Pt complains of nose bleed and headache.  Review of Systems  Positive: Epistaxis, heaedache Negative: fever  Physical Exam  BP (!) 151/96 (BP Location: Right Arm)   Pulse 72   Temp 97.9 F (36.6 C)   Resp 18   Wt 113.4 kg   SpO2 99%   BMI 33.91 kg/m  Gen:   Awake, no distress   Resp:  Normal effort  MSK:   Moves extremities without difficulty  Other:    Medical Decision Making  Medically screening exam initiated at 1:31 AM.  Appropriate orders placed.  Thomas Ortega was informed that the remainder of the evaluation will be completed by another provider, this initial triage assessment does not replace that evaluation, and the importance of remaining in the ED until their evaluation is complete.  No active epistaxis at this time   Zadie Rhine, MD 01/02/23 [phone]

## 2023-01-02 NOTE — ED Provider Notes (Signed)
[place] [facility] DE[facility]MENT AT [facility] Provider Note   CSN: [id number] Arrival date & time: 01/02/23  0118     History  Chief Complaint  Patient presents with   Epistaxis    Omir Cooprider is a 62 y.o. male.  The history is provided by the patient.  Epistaxis Location:  L nare Severity:  Mild Timing:  Constant Progression:  Resolved Chronicity:  New Context: not anticoagulants   Associated symptoms: headaches    Patient with history of high retention diabetes presents with mild headache and nosebleed.  Patient reports after he off work he had a mild diffuse headache and left-sided nosebleed.  It has been ongoing about 30 minutes prior to arrival.  It is now improving.  He is not on anticoagulation.  Denies any other acute complaints.  No focal weakness is reported   Past Medical History:  Diagnosis Date   Diabetes mellitus    Hepatitis B    Hypertension    Nausea     Home Medications Prior to Admission medications   Medication Sig Start Date End Date Taking? Authorizing Provider  ACCU-CHEK FASTCLIX LANCETS MISC Use to check blood sugar 1-2 times daily.  Dx  E11.9 Patient not taking: Reported on 05/18/2018 01/25/15   Sandford Craze, NP  albuterol (PROVENTIL HFA;VENTOLIN HFA) 108 (90 Base) MCG/ACT inhaler Inhale 1-2 puffs into the lungs every 4 (four) hours as needed for wheezing or shortness of breath (or cough). 05/18/18   Street, [place], PA-C  atorvastatin (LIPITOR) 40 MG tablet Take 1 tablet (40 mg total) by mouth daily. Patient not taking: Reported on 05/18/2018 06/17/17   Tysinger, Kermit Balo, PA-C  benzonatate (TESSALON) 100 MG capsule Take 1 capsule (100 mg total) by mouth every 8 (eight) hours. 05/15/18   Wurst, [place], PA-C  glucose blood (ACCU-CHEK SMARTVIEW) test strip Use as instructed to check blood sugar 1-2 times a day. Patient not taking: Reported on 05/18/2018 01/25/15   Sandford Craze, NP  metFORMIN (GLUCOPHAGE) 1000 MG tablet  TAKE 1 TABLET (1,000 MG TOTAL) BY MOUTH 2 (TWO) TIMES DAILY WITH A MEAL 07/30/17   Tysinger, Kermit Balo, PA-C  ondansetron (ZOFRAN) 4 MG tablet Take 1 tablet (4 mg total) by mouth every 6 (six) hours. 05/15/18   Rennis Harding, PA-C      Allergies    Patient has no known allergies.    Review of Systems   Review of Systems  HENT:  Positive for nosebleeds.   Neurological:  Positive for headaches. Negative for weakness and numbness.    Physical Exam Updated Vital Signs BP (!) 151/96 (BP Location: Right Arm)   Pulse 72   Temp 97.9 F (36.6 C)   Resp 18   Wt 113.4 kg   SpO2 99%   BMI 33.91 kg/m  Physical Exam CONSTITUTIONAL: Well developed/well nourished HEAD: Normocephalic/atraumatic EYES: EOMI/PERRL, no nystagmus, no ptosis ENMT: Mucous membranes moist No blood in the oropharynx.  No blood noted in either nare NECK: supple no meningeal signs, no bruits NEURO:Awake/alert, face symmetric, no arm or leg drift is noted Equal 5/5 strength with shoulder abduction, elbow flex/extension, wrist flex/extension in upper extremities and equal hand grips bilaterally Equal 5/5 strength with hip flexion,knee flex/extension EXTREMITIES: pulses normal, full ROM SKIN: warm, color normal PSYCH: no abnormalities of mood noted, alert and oriented to situation  ED Results / Procedures / Treatments   Labs (all labs ordered are listed, but only abnormal results are displayed) Labs Reviewed - No data to display  EKG None  Radiology No results found.  Procedures Procedures    Medications Ordered in ED Medications  oxymetazoline (AFRIN) 0.05 % nasal spray 1 spray (1 spray Each Nare Given 01/02/23 0131)  acetaminophen (TYLENOL) tablet 650 mg (650 mg Oral Given 01/02/23 0342)    ED Course/ Medical Decision Making/ A&P                                 Medical Decision Making Risk OTC drugs.   Patient reports he had mild headache and nosebleed Patient was given Afrin upon arrival.  He has  had no further bleeding.  He reports his headache is improved.  He is smiling and in no acute distress.  Patient is safe for discharge       Final Clinical Impression(s) / ED Diagnoses Final diagnoses:  Epistaxis    Rx / DC Orders ED Discharge Orders     None         Zadie Rhine, MD 01/02/23 [phone]
